# Patient Record
Sex: Female | Born: 1980 | Race: White | Hispanic: No | Marital: Married | Smoking: Never smoker
Health system: Southern US, Community
[De-identification: ages and names within clinical notes are randomized; demographics above are authoritative.]

## PROBLEM LIST (undated history)

## (undated) ENCOUNTER — Inpatient Hospital Stay (HOSPITAL_COMMUNITY): Payer: Self-pay

## (undated) DIAGNOSIS — Z789 Other specified health status: Secondary | ICD-10-CM

---

## 2012-01-15 ENCOUNTER — Inpatient Hospital Stay (HOSPITAL_COMMUNITY)
Admission: AD | Admit: 2012-01-15 | Discharge: 2012-01-15 | Disposition: A | Discharge status: Home / Self Care | Payer: BC Managed Care – PPO | Source: Ambulatory Visit | Attending: Obstetrics and Gynecology | Admitting: Obstetrics and Gynecology

## 2012-01-15 ENCOUNTER — Encounter (HOSPITAL_COMMUNITY): Payer: Self-pay | Admitting: *Deleted

## 2012-01-15 ENCOUNTER — Inpatient Hospital Stay (HOSPITAL_COMMUNITY): Payer: BC Managed Care – PPO

## 2012-01-15 DIAGNOSIS — O209 Hemorrhage in early pregnancy, unspecified: Secondary | ICD-10-CM

## 2012-01-15 HISTORY — DX: Other specified health status: Z78.9

## 2012-01-15 LAB — URINE MICROSCOPIC-ADD ON

## 2012-01-15 LAB — URINALYSIS, ROUTINE W REFLEX MICROSCOPIC
Glucose, UA: NEGATIVE mg/dL
Ketones, ur: NEGATIVE mg/dL
Leukocytes, UA: NEGATIVE
Nitrite: NEGATIVE
Protein, ur: NEGATIVE mg/dL
pH: 7 (ref 5.0–8.0)

## 2012-01-15 LAB — CBC
MCH: 31.4 pg (ref 26.0–34.0)
MCHC: 34.6 g/dL (ref 30.0–36.0)
Platelets: 265 10*3/uL (ref 150–400)
RDW: 12.7 % (ref 11.5–15.5)

## 2012-01-15 LAB — ABO/RH: ABO/RH(D): A POS

## 2012-01-15 LAB — POCT PREGNANCY, URINE: Preg Test, Ur: POSITIVE — AB

## 2012-01-15 LAB — WET PREP, GENITAL: Clue Cells Wet Prep HPF POC: NONE SEEN

## 2012-01-15 NOTE — Progress Notes (Signed)
Pt in c/o intermittent vaginal bleeding since last Wednesday.  Light up until today around 1630.  Started having cramping and went to bathroom, had bright red bleeding like start of period, with quarter-sized clot.  Has had bright red bleeding since then.  Has used 1 pad.

## 2012-01-15 NOTE — ED Provider Notes (Signed)
History   Pt presents today c/o vag spotting that began last Wed. She states she began bleeding heavily today around 4pm. She also c/o lower abd cramping that is worse on the left side. She denies fever, dysuria, or any other sx at this time.  No chief complaint on file.  HPI  OB History    Grav Para Term Preterm Abortions TAB SAB Ect Mult Living   1               Past Medical History  Diagnosis Date  . No pertinent past medical history     Past Surgical History  Procedure Date  . No past surgeries     Family History  Problem Relation Age of Onset  . Cancer Father   . Stroke Maternal Grandmother     History  Substance Use Topics  . Smoking status: Never Smoker   . Smokeless tobacco: Not on file  . Alcohol Use: No    Allergies: No Known Allergies  Prescriptions prior to admission  Medication Sig Dispense Refill  . Prenatal Vit-Fe Fumarate-FA (PRENATAL MULTIVITAMIN) TABS Take 1 tablet by mouth daily.        Review of Systems  Constitutional: Negative for fever.  Eyes: Negative for blurred vision and double vision.  Cardiovascular: Negative for chest pain and palpitations.  Gastrointestinal: Positive for abdominal pain. Negative for nausea, vomiting, diarrhea and constipation.  Genitourinary: Negative for dysuria, urgency, frequency and hematuria.  Neurological: Negative for dizziness and headaches.  Psychiatric/Behavioral: Negative for depression and suicidal ideas.   Physical Exam   Blood pressure 113/78, pulse 74, temperature 98.8 F (37.1 C), temperature source Oral, resp. rate 20, height 5\' 6"  (1.676 m), weight 131 lb 2 oz (59.478 kg), last menstrual period 11/26/2011, SpO2 99.00%.  Physical Exam  Nursing note and vitals reviewed. Constitutional: She is oriented to person, place, and time. She appears well-developed and well-nourished. No distress.  HENT:  Head: Normocephalic and atraumatic.  Eyes: EOM are normal.  GI: Soft. She exhibits no distension  and no mass. There is tenderness. There is no rebound and no guarding.  Genitourinary: There is bleeding around the vagina. No vaginal discharge found.       Cervix Lg/closed. Blood clot noted in the vag vault.  Neurological: She is alert and oriented to person, place, and time.  Skin: Skin is warm and dry. She is not diaphoretic.  Psychiatric: She has a normal mood and affect. Her behavior is normal. Judgment and thought content normal.    MAU Course  Procedures  Wet prep and GC/Chlamydia cultures done.  Results for orders placed during the hospital encounter of 01/15/12 (from the past 24 hour(s))  URINALYSIS, ROUTINE W REFLEX MICROSCOPIC     Status: Abnormal   Collection Time   01/15/12  8:19 PM      Component Value Range   Color, Urine AMBER (*) YELLOW    APPearance CLOUDY (*) CLEAR    Specific Gravity, Urine 1.010  1.005 - 1.030    pH 7.0  5.0 - 8.0    Glucose, UA NEGATIVE  NEGATIVE (mg/dL)   Hgb urine dipstick LARGE (*) NEGATIVE    Bilirubin Urine NEGATIVE  NEGATIVE    Ketones, ur NEGATIVE  NEGATIVE (mg/dL)   Protein, ur NEGATIVE  NEGATIVE (mg/dL)   Urobilinogen, UA 0.2  0.0 - 1.0 (mg/dL)   Nitrite NEGATIVE  NEGATIVE    Leukocytes, UA NEGATIVE  NEGATIVE   URINE MICROSCOPIC-ADD ON  Status: Abnormal   Collection Time   01/15/12  8:19 PM      Component Value Range   Squamous Epithelial / LPF RARE  RARE    RBC / HPF TOO NUMEROUS TO COUNT  <3 (RBC/hpf)   Bacteria, UA FEW (*) RARE   POCT PREGNANCY, URINE     Status: Abnormal   Collection Time   01/15/12  8:34 PM      Component Value Range   Preg Test, Ur POSITIVE (*) NEGATIVE   ABO/RH     Status: Normal   Collection Time   01/15/12  9:00 PM      Component Value Range   ABO/RH(D) A POS    HCG, QUANTITATIVE, PREGNANCY     Status: Abnormal   Collection Time   01/15/12  9:00 PM      Component Value Range   hCG, Beta Chain, Quant, S 2462 (*) <5 (mIU/mL)  CBC     Status: Abnormal   Collection Time   01/15/12  9:00 PM       Component Value Range   WBC 10.6 (*) 4.0 - 10.5 (K/uL)   RBC 4.17  3.87 - 5.11 (MIL/uL)   Hemoglobin 13.1  12.0 - 15.0 (g/dL)   HCT 16.1  09.6 - 04.5 (%)   MCV 90.9  78.0 - 100.0 (fL)   MCH 31.4  26.0 - 34.0 (pg)   MCHC 34.6  30.0 - 36.0 (g/dL)   RDW 40.9  81.1 - 91.4 (%)   Platelets 265  150 - 400 (K/uL)  WET PREP, GENITAL     Status: Normal   Collection Time   01/15/12  9:15 PM      Component Value Range   Yeast, Wet Prep NONE SEEN  NONE SEEN    Trich, Wet Prep NONE SEEN  NONE SEEN    Clue Cells, Wet Prep NONE SEEN  NONE SEEN    WBC, Wet Prep HPF POC NONE SEEN  NONE SEEN    US Ob Comp Less 14 Wks  01/15/2012  *RADIOLOGY REPORT*  Clinical Data: Bleeding, cramping; quantitative beta HCG 2462  OBSTETRIC <14 WK [place] AND TRANSVAGINAL OB US  Technique:  Both transabdominal and transvaginal ultrasound examinations were performed for complete evaluation of the gestation as well as the maternal uterus, adnexal regions, and pelvic cul-de-sac.  Transvaginal technique was performed to assess early pregnancy.  Comparison:  None  Intrauterine gestational sac:  Not identified Yolk sac: N/A Embryo: N/A Cardiac Activity: N/A Heart Rate: N/A bpm  Maternal uterus/adnexae: Left ovary normal size and morphology, 3.2 x 1.8 x 2.3 cm. Right ovary normal size and morphology, 4.3 x 1.8 x 2.0 cm. Endometrial complex appears abnormally thickened and inhomogeneous at 16 mm diameter. No endometrial fluid, fluid collection, or products of conception identified. Trace free pelvic fluid. No focal uterine mass. No adnexal masses seen.  IMPRESSION: No intrauterine gestation identified. Abnormally thickened and slightly heterogeneous appearing endometrial complex, question blood. With observed quantitative beta HCG, a pregnancy should be visible. Differential diagnosis includes spontaneous abortion and ectopic pregnancy. Follow-up serial quantitative beta HCG and/or ultrasound recommended to definitively exclude ectopic pregnancy.   Original Report Authenticated By: Lollie Marrow, M.D.   US Ob Transvaginal  01/15/2012  *RADIOLOGY REPORT*  Clinical Data: Bleeding, cramping; quantitative beta HCG 2462  OBSTETRIC <14 WK [place] AND TRANSVAGINAL OB US  Technique:  Both transabdominal and transvaginal ultrasound examinations were performed for complete evaluation of the gestation as well as the maternal  uterus, adnexal regions, and pelvic cul-de-sac.  Transvaginal technique was performed to assess early pregnancy.  Comparison:  None  Intrauterine gestational sac:  Not identified Yolk sac: N/A Embryo: N/A Cardiac Activity: N/A Heart Rate: N/A bpm  Maternal uterus/adnexae: Left ovary normal size and morphology, 3.2 x 1.8 x 2.3 cm. Right ovary normal size and morphology, 4.3 x 1.8 x 2.0 cm. Endometrial complex appears abnormally thickened and inhomogeneous at 16 mm diameter. No endometrial fluid, fluid collection, or products of conception identified. Trace free pelvic fluid. No focal uterine mass. No adnexal masses seen.  IMPRESSION: No intrauterine gestation identified. Abnormally thickened and slightly heterogeneous appearing endometrial complex, question blood. With observed quantitative beta HCG, a pregnancy should be visible. Differential diagnosis includes spontaneous abortion and ectopic pregnancy. Follow-up serial quantitative beta HCG and/or ultrasound recommended to definitively exclude ectopic pregnancy.  Original Report Authenticated By: Lollie Marrow, M.D.     Assessment and Plan  Bleeding in preg: discussed with pt at length. She will return in 2 days for repeat B-quant. Discussed diet, activity, risks, and precautions.  Clinton Gallant. Rice III, DrHSc, MPAS, PA-C  01/15/2012, 9:18 PM   Henrietta Hoover, PA 01/15/12 2251

## 2012-01-15 NOTE — Progress Notes (Signed)
PT SAYS    ON WED- SHE HAD BROWN SPOTTING WHEN SHE WIPED- CALLED DR-  NO SEX- NO LIFTING.   Thursday OK.  THEN  FRI- BLEEDING HEAVY- RED - 1 EPISODE- PT THOUGHT NL.  SAT/ SUN- LIGHT  BROWN D/C.   THIS AM HAD CLEAR D/C.   THEN TONIGHT AT 430- FELT MORE CRAMPING- TO B-ROOM-  HAD  QUARTER SIZE CLOT- FELT LIKE CYCLE- CRAMPING LOWER ABD- WITH OCC SHARP PAIN.  [name]  SINCE THEN WENT TO B-ROOM  X2-  WITH SMALL CLOTS.  IN TRIAGE-  PAD-  ON SMALL AMT LIGHT RED . [name]

## 2012-01-16 LAB — GC/CHLAMYDIA PROBE AMP, GENITAL: Chlamydia, DNA Probe: NEGATIVE

## 2012-08-02 ENCOUNTER — Ambulatory Visit (INDEPENDENT_AMBULATORY_CARE_PROVIDER_SITE_OTHER): Payer: BC Managed Care – PPO

## 2012-08-02 VITALS — BP 102/60 | Ht 66.0 in | Wt 125.0 lb

## 2012-08-02 DIAGNOSIS — Z7189 Other specified counseling: Secondary | ICD-10-CM

## 2012-08-11 NOTE — Progress Notes (Signed)
Pt presents at [redacted]w[redacted]d for free consult to learn about practice, and is interested in transferring care from [facility].   Questions r/e practice routines and midwifery care answered.  Pt's husband at visit.  They are self-pay, so Raelene Bott, Geophysicist/field seismologist also answered Questions, as Laurel Dimmer (office administrator) was out of office.  If pt continues to desire transfer, instructed her to schedule NOB interview and work-up, and also disc'd upcoming anatomy U/s for 19-20.  F/u prn.

## 2012-10-11 LAB — OB RESULTS CONSOLE HIV ANTIBODY (ROUTINE TESTING): HIV: NONREACTIVE

## 2012-10-11 LAB — OB RESULTS CONSOLE ABO/RH

## 2012-10-11 LAB — OB RESULTS CONSOLE GC/CHLAMYDIA: Gonorrhea: NEGATIVE

## 2012-10-11 LAB — OB RESULTS CONSOLE ANTIBODY SCREEN: Antibody Screen: NEGATIVE

## 2012-10-15 IMAGING — US US OB COMP LESS 14 WK
1 series · 13 of 28 positions shown · non-contrast
Comparison: None

CLINICAL DATA: Bleeding, cramping; quantitative beta HCG 4074

OBSTETRIC <14 WK US AND TRANSVAGINAL OB US
TECHNIQUE: Both transabdominal and transvaginal ultrasound
examinations were performed for complete evaluation of the
gestation as well as the maternal uterus, adnexal regions, and
pelvic cul-de-sac.  Transvaginal technique was performed to assess
early pregnancy.

[Series 1: us ob comp less 14 wks · 13 of 31 slices shown]
[im 2/31]
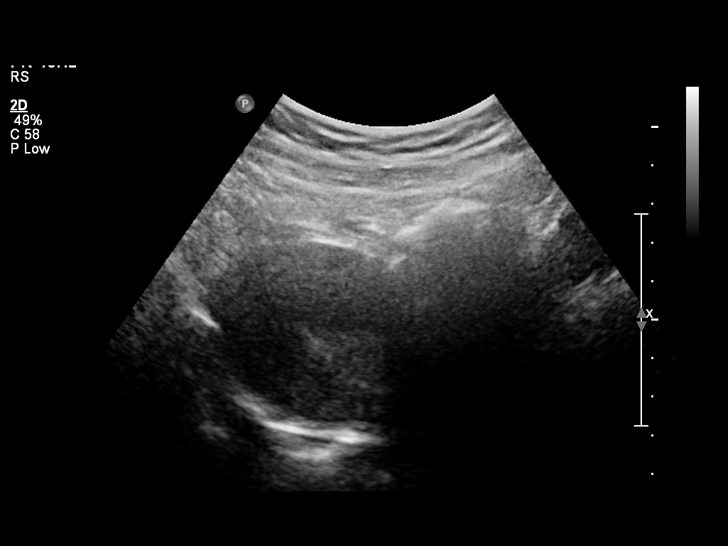
[im 4/31]
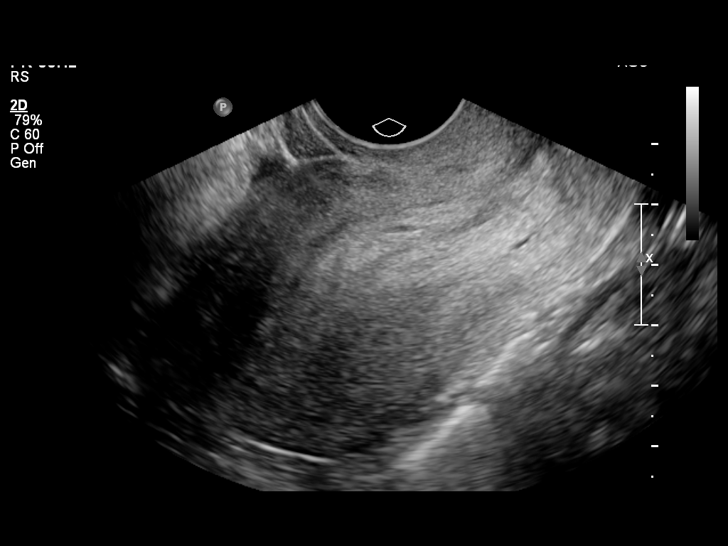
[im 6/31]
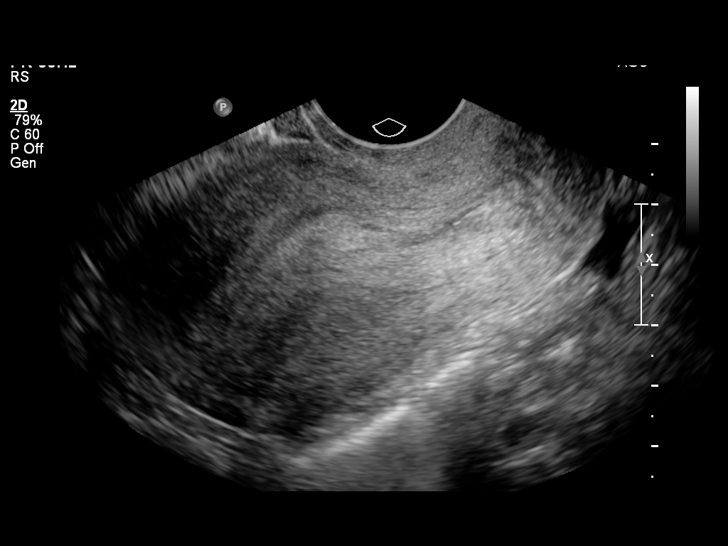
[im 8/31]
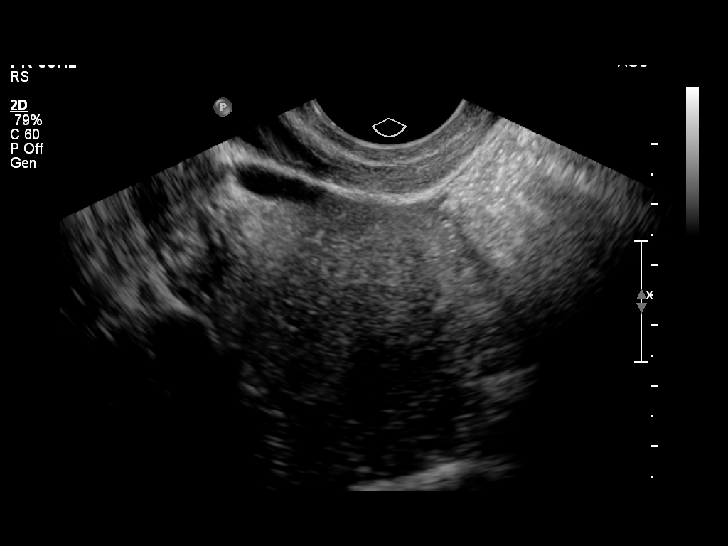
[im 11/31]
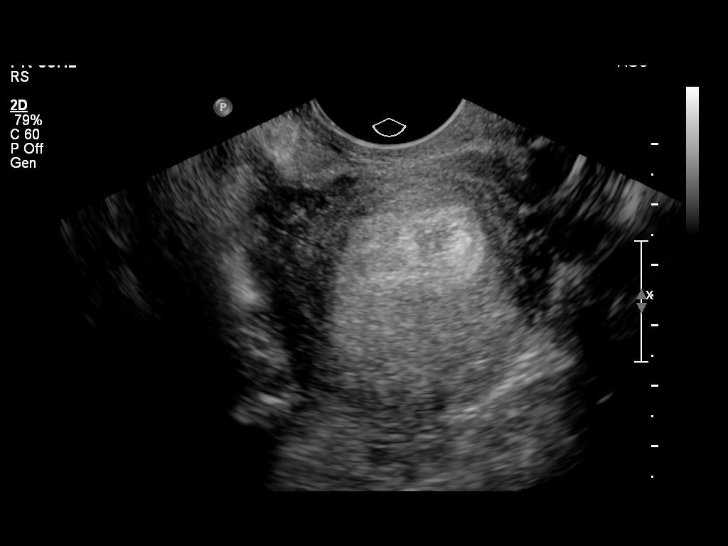
[im 13/31]
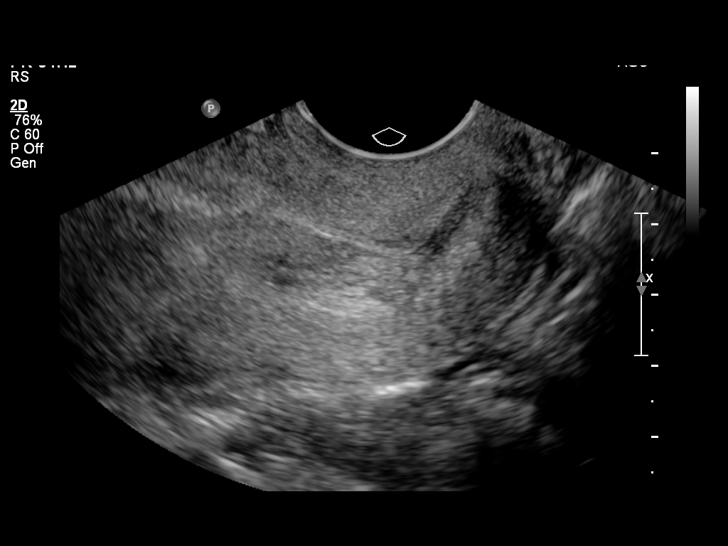
[im 16/31]
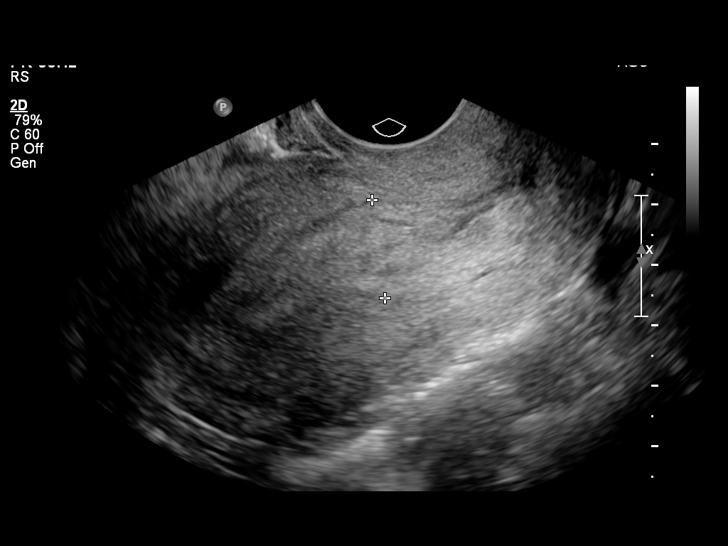
[im 18/31]
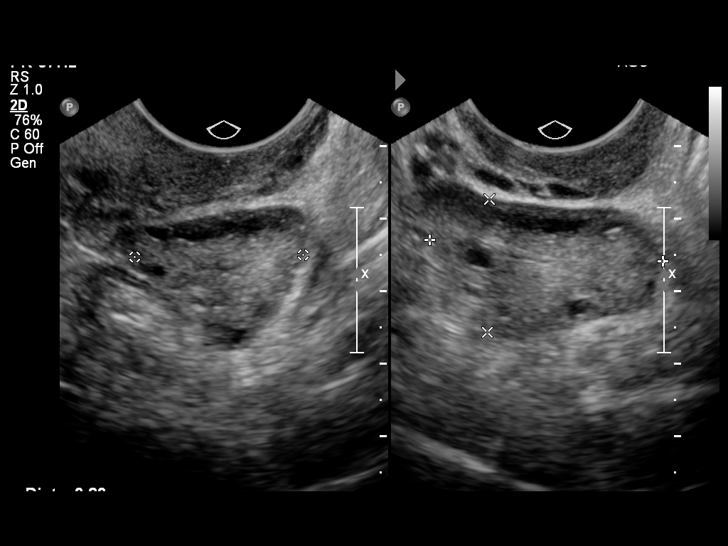
[im 21/31]
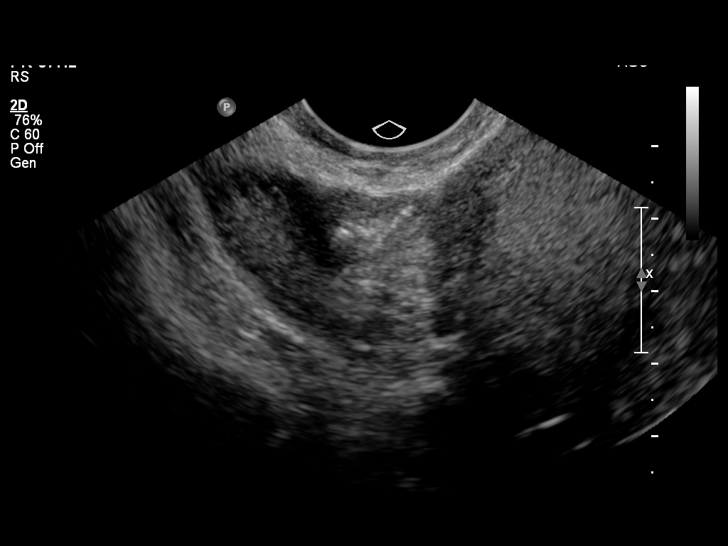
[im 23/31]
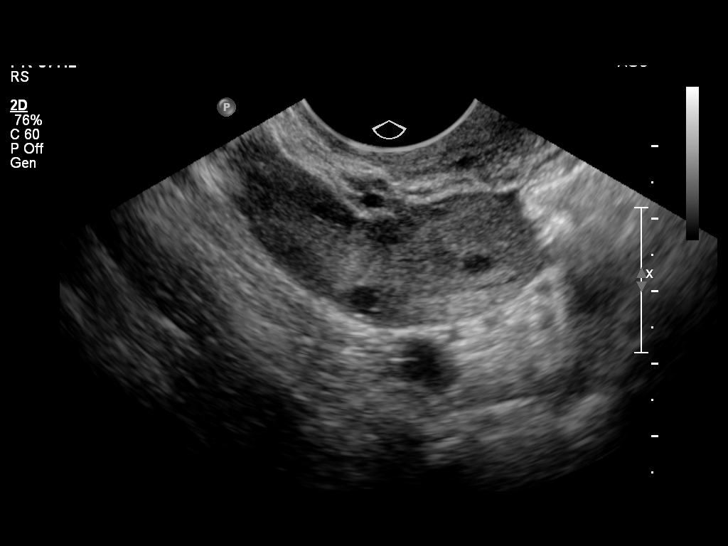
[im 25/31]
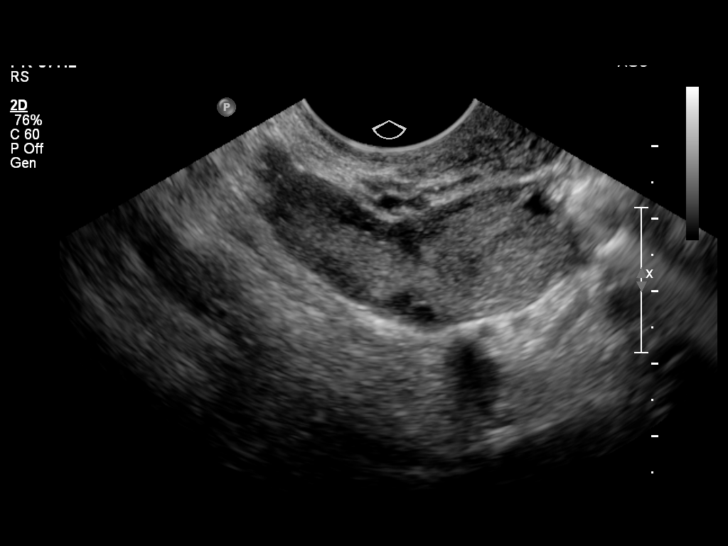
[im 27/31]
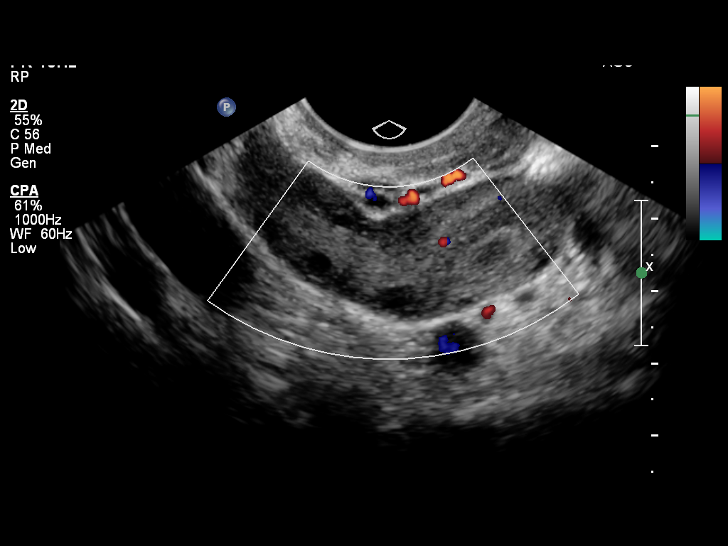
[im 29/31]
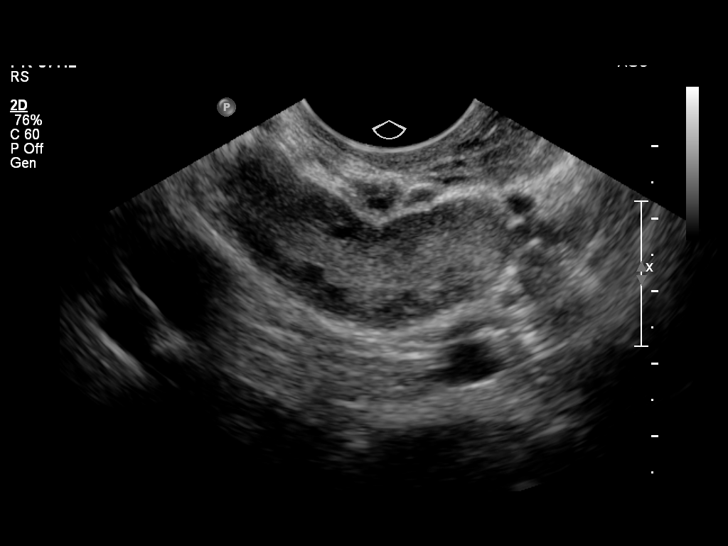

[13 of 28 positions shown; findings below may reference images not displayed]

Intrauterine gestational sac:  Not identified
Yolk sac: N/A
Embryo: N/A
Cardiac Activity: N/A
Heart Rate: N/A bpm

Maternal uterus/adnexae:
Left ovary normal size and morphology, 3.2 x 1.8 x 2.3 cm.
Right ovary normal size and morphology, 4.3 x 1.8 x 2.0 cm.
Endometrial complex appears abnormally thickened and inhomogeneous
at 16 mm diameter.
No endometrial fluid, fluid collection, or products of conception
identified.
Trace free pelvic fluid.
No focal uterine mass.
No adnexal masses seen.
IMPRESSION: No intrauterine gestation identified.
Abnormally thickened and slightly heterogeneous appearing
endometrial complex, question blood.
With observed quantitative beta HCG, a pregnancy should be visible.
Differential diagnosis includes spontaneous abortion and ectopic
pregnancy.
Follow-up serial quantitative beta HCG and/or ultrasound
recommended to definitively exclude ectopic pregnancy.

## 2013-01-01 LAB — OB RESULTS CONSOLE GBS: GBS: NEGATIVE

## 2013-02-01 ENCOUNTER — Inpatient Hospital Stay (HOSPITAL_COMMUNITY): Payer: BC Managed Care – PPO | Admitting: Anesthesiology

## 2013-02-01 ENCOUNTER — Encounter (HOSPITAL_COMMUNITY): Payer: Self-pay | Admitting: Anesthesiology

## 2013-02-01 ENCOUNTER — Encounter (HOSPITAL_COMMUNITY): Payer: Self-pay | Admitting: *Deleted

## 2013-02-01 ENCOUNTER — Inpatient Hospital Stay (HOSPITAL_COMMUNITY)
Admission: AD | Admit: 2013-02-01 | Discharge: 2013-02-04 | DRG: 370 | Disposition: A | Discharge status: Home / Self Care | Payer: BC Managed Care – PPO | Source: Ambulatory Visit | Attending: Obstetrics & Gynecology | Admitting: Obstetrics & Gynecology

## 2013-02-01 ENCOUNTER — Encounter (HOSPITAL_COMMUNITY)
Admission: AD | Disposition: A | Discharge status: Home / Self Care | Payer: Self-pay | Source: Ambulatory Visit | Attending: Obstetrics & Gynecology

## 2013-02-01 DIAGNOSIS — D62 Acute posthemorrhagic anemia: Secondary | ICD-10-CM | POA: Diagnosis not present

## 2013-02-01 DIAGNOSIS — Z98891 History of uterine scar from previous surgery: Secondary | ICD-10-CM | POA: Diagnosis present

## 2013-02-01 DIAGNOSIS — O9903 Anemia complicating the puerperium: Secondary | ICD-10-CM | POA: Diagnosis not present

## 2013-02-01 DIAGNOSIS — O324XX0 Maternal care for high head at term, not applicable or unspecified: Secondary | ICD-10-CM | POA: Diagnosis present

## 2013-02-01 LAB — CBC
MCH: 34.1 pg — ABNORMAL HIGH (ref 26.0–34.0)
MCV: 93.6 fL (ref 78.0–100.0)
Platelets: 173 10*3/uL (ref 150–400)
RBC: 3.61 MIL/uL — ABNORMAL LOW (ref 3.87–5.11)
RDW: 12.7 % (ref 11.5–15.5)

## 2013-02-01 LAB — OB RESULTS CONSOLE GBS: GBS: NEGATIVE

## 2013-02-01 SURGERY — Surgical Case
Anesthesia: Spinal | Wound class: Clean Contaminated

## 2013-02-01 MED ORDER — OXYTOCIN BOLUS FROM INFUSION: 500.0000 mL | INTRAVENOUS | Status: DC

## 2013-02-01 MED ORDER — LIDOCAINE HCL (PF) 1 % IJ SOLN: 30.0000 mL | INTRAMUSCULAR | Status: DC | PRN

## 2013-02-01 MED ORDER — LACTATED RINGERS IV SOLN: INTRAVENOUS | Status: DC

## 2013-02-01 MED ORDER — OXYTOCIN 40 UNITS IN LACTATED RINGERS INFUSION - SIMPLE MED: 62.5000 mL/h | INTRAVENOUS | Status: DC

## 2013-02-01 MED ORDER — ONDANSETRON HCL 4 MG/2ML IJ SOLN: 4.0000 mg | Freq: Four times a day (QID) | INTRAMUSCULAR | Status: DC | PRN

## 2013-02-01 MED ORDER — CITRIC ACID-SODIUM CITRATE 334-500 MG/5ML PO SOLN
30.0000 mL | ORAL | Status: DC | PRN
  Administered 2013-02-01: 30 mL via ORAL
  Filled 2013-02-01: qty 15

## 2013-02-01 MED ORDER — IBUPROFEN 600 MG PO TABS: 600.0000 mg | ORAL_TABLET | Freq: Four times a day (QID) | ORAL | Status: DC | PRN

## 2013-02-01 MED ORDER — ACETAMINOPHEN 325 MG PO TABS: 650.0000 mg | ORAL_TABLET | ORAL | Status: DC | PRN

## 2013-02-01 MED ORDER — MORPHINE SULFATE (PF) 0.5 MG/ML IJ SOLN
INTRAMUSCULAR | Status: DC | PRN
  Administered 2013-02-01: 150 ug via INTRATHECAL

## 2013-02-01 MED ORDER — LACTATED RINGERS IV SOLN: 500.0000 mL | INTRAVENOUS | Status: DC | PRN

## 2013-02-01 MED ORDER — OXYCODONE-ACETAMINOPHEN 5-325 MG PO TABS: 1.0000 | ORAL_TABLET | ORAL | Status: DC | PRN

## 2013-02-01 MED ORDER — FENTANYL CITRATE 0.05 MG/ML IJ SOLN
INTRAMUSCULAR | Status: DC | PRN
  Administered 2013-02-01: 25 ug via INTRATHECAL

## 2013-02-01 MED ORDER — BUPIVACAINE IN DEXTROSE 0.75-8.25 % IT SOLN
INTRATHECAL | Status: DC | PRN
  Administered 2013-02-01: 13 mg via INTRATHECAL

## 2013-02-01 MED ORDER — CEFAZOLIN SODIUM-DEXTROSE 2-3 GM-% IV SOLR
2.0000 g | INTRAVENOUS | Status: DC
  Filled 2013-02-01: qty 50

## 2013-02-01 SURGICAL SUPPLY — 40 items
BENZOIN TINCTURE PRP APPL 2/3 (GAUZE/BANDAGES/DRESSINGS) ×2 IMPLANT
CLOTH BEACON ORANGE TIMEOUT ST (SAFETY) ×2 IMPLANT
CONTAINER PREFILL 10% NBF 15ML (MISCELLANEOUS) IMPLANT
DRAPE LG THREE QUARTER DISP (DRAPES) ×2 IMPLANT
DRESSING TELFA 8X3 (GAUZE/BANDAGES/DRESSINGS) IMPLANT
DRSG OPSITE POSTOP 4X10 (GAUZE/BANDAGES/DRESSINGS) ×2 IMPLANT
DURAPREP 26ML APPLICATOR (WOUND CARE) ×2 IMPLANT
ELECT REM PT RETURN 9FT ADLT (ELECTROSURGICAL) ×2
ELECTRODE REM PT RTRN 9FT ADLT (ELECTROSURGICAL) ×1 IMPLANT
EXTRACTOR VACUUM KIWI (MISCELLANEOUS) IMPLANT
EXTRACTOR VACUUM M CUP 4 TUBE (SUCTIONS) IMPLANT
GAUZE SPONGE 4X4 12PLY STRL LF (GAUZE/BANDAGES/DRESSINGS) IMPLANT
GLOVE BIO SURGEON STRL SZ7 (GLOVE) ×2 IMPLANT
GLOVE BIOGEL PI IND STRL 7.0 (GLOVE) ×2 IMPLANT
GLOVE BIOGEL PI INDICATOR 7.0 (GLOVE) ×2
GOWN PREVENTION PLUS LG XLONG (DISPOSABLE) ×6 IMPLANT
KIT ABG SYR 3ML LUER SLIP (SYRINGE) IMPLANT
NEEDLE HYPO 25X5/8 SAFETYGLIDE (NEEDLE) IMPLANT
NS IRRIG 1000ML POUR BTL (IV SOLUTION) ×2 IMPLANT
PACK C SECTION WH (CUSTOM PROCEDURE TRAY) ×2 IMPLANT
PAD ABD 7.5X8 STRL (GAUZE/BANDAGES/DRESSINGS) IMPLANT
PAD OB MATERNITY 4.3X12.25 (PERSONAL CARE ITEMS) ×2 IMPLANT
RTRCTR C-SECT PINK 25CM LRG (MISCELLANEOUS) IMPLANT
SLEEVE SCD COMPRESS KNEE MED (MISCELLANEOUS) ×2 IMPLANT
STAPLER VISISTAT 35W (STAPLE) IMPLANT
STRIP CLOSURE SKIN 1/2X4 (GAUZE/BANDAGES/DRESSINGS) ×2 IMPLANT
SUT MNCRL 0 VIOLET CTX 36 (SUTURE) ×3 IMPLANT
SUT MONOCRYL 0 CTX 36 (SUTURE) ×3
SUT PLAIN 0 NONE (SUTURE) IMPLANT
SUT PLAIN 2 0 (SUTURE) ×1
SUT PLAIN ABS 2-0 CT1 27XMFL (SUTURE) ×1 IMPLANT
SUT VIC AB 0 CT1 27 (SUTURE) ×2
SUT VIC AB 0 CT1 27XBRD ANBCTR (SUTURE) ×2 IMPLANT
SUT VIC AB 2-0 CT1 27 (SUTURE) ×2
SUT VIC AB 2-0 CT1 TAPERPNT 27 (SUTURE) ×2 IMPLANT
SUT VIC AB 4-0 KS 27 (SUTURE) ×2 IMPLANT
SUT VICRYL 0 TIES 12 18 (SUTURE) IMPLANT
TOWEL OR 17X24 6PK STRL BLUE (TOWEL DISPOSABLE) ×6 IMPLANT
TRAY FOLEY CATH 14FR (SET/KITS/TRAYS/PACK) ×2 IMPLANT
WATER STERILE IRR 1000ML POUR (IV SOLUTION) ×2 IMPLANT

## 2013-02-01 NOTE — Progress Notes (Signed)
Rhonda Parrish is a 32 y.o. G2P0010 at [redacted]w[redacted]d, active labor, natural progression to complete and pushing since 6.30 pm with few breaks. Maternal exhaustion and not progress in fetal position/.   Objective: BP 127/70  Pulse 104  Temp(Src) 98.2 F (36.8 C) (Oral)  Resp 20  Ht 5\' 5"  (1.651 m)  Wt 157 lb (71.215 kg)  BMI 26.13 kg/m2  LMP 04/25/2012   FHR: 150s bpm, variability: moderate,  accelerations:  Present,  decelerations:  Absent UC: q 2 -3 min SVE:  Complete/+1, OT, cannot turn with pushing or manual rotation.   Assessment / Plan: Arrest of descent, ROT position, caput and moulding noted. EBW 6-7 lbs.  Need to proceed with cesarean delivery. Patient anxious and hesitant, recommendation made after careful consideration of operative vag delivery as well but station will not permit.   Risks/complications of surgery reviewed incl infection, bleeding, damage to internal organs including bladder, bowels, ureters, blood vessels, other risks from anesthesia, VTE and delayed complications of any surgery, complications in future surgery reviewed. Also discussed neonatal complications incl difficult delivery, laceration, vacuum assistance, TTN etc. Pt understands and agrees, all concerns addressed.    Rhonda Parrish R 02/01/2013, 11:05 PM

## 2013-02-01 NOTE — Progress Notes (Signed)
Pushing with each contraction O VSS BP 115/62  Pulse 85  Temp(Src) 98.6 F (37 C) (Oral)  Resp 16  Ht 5\' 5"  (1.651 m)  Wt 71.215 kg (157 lb)  BMI 26.13 kg/m2  LMP 04/25/2012       fhts category 135 - 150 bpm      abd soft between uc      Contractions 1: 3 mins      Vag 10/100%/+1, Vx with no caput A Pushing well and coping well P Expectant SVD.  Earl Gala, CNM.

## 2013-02-01 NOTE — MAU Note (Signed)
Contractions started at 0515 this morning

## 2013-02-01 NOTE — Progress Notes (Signed)
Patient is now getting tired and out of tub and resting bed for short duration. O VSS BP 120/76  Pulse 87  Temp(Src) 98.6 F (37 C) (Oral)  Resp 20  Ht 5\' 5"  (1.651 m)  Wt 157 lb (71.215 kg)  BMI 26.13 kg/m2  LMP 04/25/2012       Fhts 140 bpm -intermittent monitoring.      abd soft between uc      Contractions have spaced 1: 4 - 5 mins and shorter duration of less than 60 secs      Have advised AROM to augment labor but patient stated that she will think about it.      Doula wants to try nipple stimulation. patient and patient's husband what to try this method of oxytocin release prior       To AROM.       As patient is tried, offered patient  IM narcotics and refused and CNM was told not to offer medication again.      Vag 7/90/0  To  +1 station. Intact - membranes palpated on front of baby's head.      Patient now out on toilet and rocking on toilet. A  Contraction  Pattern has protracted  - AROM advised. P  Wait for patient to see if will allow AROM.      No narcotics to be offered.     To try nipple stimulation prior to AROM     To use Birthing Tub as per protocol      Intermittent Fetal Monitoring.  Earl Gala, CNM.

## 2013-02-01 NOTE — Progress Notes (Signed)
Patient has been resting in bed and breathing through contractions O VSS BP 105/57  Pulse 83  Temp(Src) 98.6 F (37 C) (Oral)  Resp 18  Ht 5\' 5"  (1.651 m)  Wt 157 lb (71.215 kg)  BMI 26.13 kg/m2  LMP 04/25/2012       fhts 140 bpm       abd soft between uc      Contractions 1: 3 mins      Vag 9/90%/+1      Wants to use [employer] to aid descent of  Vx.      Doula and Husband as birth coaches A Progressing well, Birthing Ball  And gravity to aid descent of Vx  P Continue care working towards, SVD.  Earl Gala, CNM.

## 2013-02-01 NOTE — Progress Notes (Signed)
Patient out of tub at 21.15 hrs, Positioned on bed in kneeling position and patient has tried to sqwat to push. Advised to change position to left later push. Mother now had started to push effectively. Patient has pushed for 40 - 45 mins with slow descent. Some caput evident. Mother has changed to semi fowler position and pushing effectively. Contacted Dr. Juliene Pina re reservations of [employer] in Pelvis and possible shoulders. O VSS BP 127/70  Pulse 104  Temp(Src) 98.2 F (36.8 C) (Oral)  Resp 20  Ht 5\' 5"  (1.651 m)  Wt 71.215 kg (157 lb)  BMI 26.13 kg/m2  LMP 04/25/2012       fhts 150 bpm      abd soft between uc      Contractions: 1: 4 mins      Vx slight descent and with better pushing had started to move baby more effectively A : Slow descent- consult Dr Juliene Pina for assessment and best management plan. P  Dr. Juliene Pina to bedside for assessment  Earl Gala, CNM.

## 2013-02-01 NOTE — Treatment Plan (Signed)
Report called to Belenda Cruise, RN charge RN. Pt may go to [phone]

## 2013-02-01 NOTE — Progress Notes (Signed)
Patient is 2 hrs pushing at 20.06 hrs O VSS BP 115/62  Pulse 85  Temp(Src) 98.6 F (37 C) (Oral)  Resp 16  Ht 5\' 5"  (1.651 m)  Wt 71.215 kg (157 lb)  BMI 26.13 kg/m2  LMP 04/25/2012       fhts 140 bpm, intermittent auscultation      abd soft between uc      Contractions 1: 3 - 4 mins      A Pushing with each contraction P Expectant management towards SVD  Earl Gala, CNM.

## 2013-02-01 NOTE — Anesthesia Preprocedure Evaluation (Signed)
Anesthesia Evaluation  Patient identified by MRN, date of birth, ID band Patient awake    Reviewed: Allergy & Precautions, H&P , NPO status , Patient's Chart, lab work & pertinent test results  Airway Mallampati: III TM Distance: >3 FB Neck ROM: full    Dental no notable dental hx. (+) Teeth Intact   Pulmonary neg pulmonary ROS,    Pulmonary exam normal       Cardiovascular negative cardio ROS  Rhythm:regular Rate:Normal     Neuro/Psych negative neurological ROS     GI/Hepatic negative GI ROS, Neg liver ROS,   Endo/Other  negative endocrine ROS  Renal/GU negative Renal ROS     Musculoskeletal   Abdominal Normal abdominal exam  (+)   Peds  Hematology negative hematology ROS (+)   Anesthesia Other Findings   Reproductive/Obstetrics (+) Pregnancy                           Anesthesia Physical Anesthesia Plan  ASA: II and emergent  Anesthesia Plan: Spinal   Post-op Pain Management:    Induction:   Airway Management Planned:   Additional Equipment:   Intra-op Plan:   Post-operative Plan:   Informed Consent: I have reviewed the patients History and Physical, chart, labs and discussed the procedure including the risks, benefits and alternatives for the proposed anesthesia with the patient or authorized representative who has indicated his/her understanding and acceptance.   Dental Advisory Given  Plan Discussed with: Anesthesiologist, CRNA and Surgeon  Anesthesia Plan Comments:         Anesthesia Quick Evaluation

## 2013-02-01 NOTE — H&P (Signed)
Rhonda Parrish is a 32 y.o. female, G3P0010 at [redacted]w[redacted]d weeks, presenting for labor progress evaluation. Plans water birth and has Doula to help with birthing process   There is no problem list on file for this patient.   History of present pregnancy: Patient entered care at 18weeks. As transfer from [facility].  EDC of 04/25/2013 was established by LMP.   Anatomy scan:  18 weeks, with normal findings and an posterior placenta 3 vessel cord  Additional [place] evaluations: none .   Significant prenatal events: none Last evaluation: MAU today 02/01/13  OB History   Grav Para Term Preterm Abortions TAB SAB Ect Mult Living   3    1  1         Past Medical History  Diagnosis Date  . No pertinent past medical history    Past Surgical History  Procedure Laterality Date  . No past surgeries     Family History: family history includes Cancer in her father and Stroke in her maternal grandmother. Social History:  reports that she has never smoked. She has never used smokeless tobacco. She reports that she does not drink alcohol or use illicit drugs.   Prenatal Transfer Tool  Maternal Diabetes: No Genetic Screening: Normal Maternal Ultrasounds/Referrals: Normal Fetal Ultrasounds or other Referrals:  None Maternal Substance Abuse:  No Significant Maternal Medications:  None Significant Maternal Lab Results: None   No Known Allergies  Physical Examination: Affect: AAO x 3 in active labor and anxious Lungs: CTAB CV: RRR Abdomen; Gravid to dates FHT"s 140 bpm baseline SVE: Dilation: 5 Station: +1 Exam by:: Octavio Manns, CNM Blood pressure 120/76, pulse 87, temperature 98.1 F (36.7 C), temperature source Oral, resp. rate 20, height 5\' 5"  (1.651 m), weight 157 lb (71.215 kg), last menstrual period 04/25/2012, unknown if currently breastfeeding. Extremities: no edema/ swelling bilaterally upper and lower  FHR: 140 bpm UCs:  1: 3 - 5 mins  Prenatal labs: ABO, Rh:  A  positive Antibody:  neg Rubella:   imm RPR:   NR HBsAg:   neg HIV:   neg GBS:  neg Sickle cell/Hgb electrophoresis:  Not completed Pap:  Normal GC: neg Chlamydia:  neg Genetic screenings: Normal Glucola:  Neg.  Assessment/Plan: IUP at [redacted]w[redacted]d Active labor and desires water birth  Plan: Admit to birthing suites Patient does not desire IV access Desires Water Birth and has Doula as birth coach GBS: neg EFM tracing/ possible intermittent monitoring Collaboration on plan with Dr.  Lissa Merlin, DENISECNM. 02/01/2013, 11:45 AM

## 2013-02-01 NOTE — Progress Notes (Signed)
Feeling more pressure with each contraction and has urge to bare down pressure. Becoming more vocal with each contraction. O VSS      fhts 140 bpm      abd soft between uc      Contractions 1: 3 mins      Vag 10/100%/+1      Back in birthing tub with husband      Strong urge to push with each contraction A : 10/100%/+1, Vx  Pushing with each contraction. P: Labor support and Intermittent monitoring after each contraction      Expectant SVD - possible water birth  Earl Gala, [place].

## 2013-02-01 NOTE — Progress Notes (Addendum)
Patient has been out of tub for 30 min interval after 90 mins in tub Had been sitting on the toilet rocking. Back in Tub after 30 min interval  BP 120/76  Pulse 87  Temp(Src) 98.6 F (37 C) (Oral)  Resp 20  Ht 5\' 5"  (1.651 m)  Wt 157 lb (71.215 kg)  BMI 26.13 kg/m2  LMP 04/25/2012 VSS      fhts 145 bpm  Intermittent monitoring.      abd soft between uc      Contractions  1: 4  mins      Vag 7/90%/, 0 to +1, Vx, Membranes palpated on front of babies head. No SROM as though at 14.40hrs.       Patient states that she is getting slightly tired. Offered to come out of tub and rest in bed with IM narcotic to aid rest       and some sleep. Declined offer at this time. Remains in tub with Doula as birth coach helping her through the               contractions and breathing to relax in between contractions.  A   Steady progress with Cx dilation, Patient is resting well between contractions. Declines analgesia. P  Patient has requested to remain in tub and continue with labor support.     Tub water maintained at comfortable 98.8 - 99.1 F Temp  Earl Gala, CNM.

## 2013-02-01 NOTE — Progress Notes (Signed)
Patient continues to push with each contraction, coping well. O VSS BP 105/57  Pulse 83  Temp(Src) 98.6 F (37 C) (Oral)  Resp 18  Ht 5\' 5"  (1.651 m)  Wt 71.215 kg (157 lb)  BMI 26.13 kg/m2  LMP 04/25/2012       Fht, 135 bpm - auscultation with each contraction      abd soft between uc      Contractions 1: 3 mins - palpate strong      Laboring in the tub A   Pushing well with each contraction  - strong urge to bare down P  Continue labor support and working towards SVD.  Earl Gala, CNM.

## 2013-02-01 NOTE — Progress Notes (Signed)
Patient has been pushing for 1 hr at 19.06 hrs. Pushing well with each contraction and coping well. Reamins in Birthing Tub. O VSS    BP 115/62  Pulse 85  Temp(Src) 98.6 F (37 C) (Oral)  Resp 16  Ht 5\' 5"  (1.651 m)  Wt 71.215 kg (157 lb)  BMI 26.13 kg/m2  LMP 04/25/2012       fhts 135 bpm - intermittent auscultation       abd soft between uc      Contractions 1: 3 mins       Offered patient Vaginal assessment for descent but declined - will re approach this subject in about 30 mins for        Assessment.  A Pushing for 1 hr and coping well  Decline eval for Fetal descent at present P Continue present management working towards SVD.  Earl Gala, CNM.

## 2013-02-01 NOTE — H&P (Signed)
Reviewed and agree with note and plan. V.Eufemia Prindle, MD  

## 2013-02-01 NOTE — Anesthesia Procedure Notes (Signed)
Spinal  Patient location during procedure: OR Start time: 02/01/2013 11:54 PM Staffing Anesthesiologist: Sahar Ryback A. Performed by: anesthesiologist  Preanesthetic Checklist Completed: patient identified, site marked, surgical consent, pre-op evaluation, timeout performed, IV checked, risks and benefits discussed and monitors and equipment checked Spinal Block Patient position: sitting Prep: site prepped and draped and DuraPrep Patient monitoring: heart rate, cardiac monitor, continuous pulse ox and blood pressure Approach: midline Location: L3-4 Injection technique: single-shot Needle Needle type: Sprotte  Needle gauge: 24 G Needle length: 9 cm Needle insertion depth: 5 cm Assessment Sensory level: T4 Additional Notes Patient tolerated procedure well. Adequate sensory level.

## 2013-02-01 NOTE — Progress Notes (Addendum)
Patient is now in birthing pool. Doula at side and coping well and breathing through contractions. Husband is also being labor support.        fhts 145 bpm. Off monitoring at present and using intermittent monitoring.       abd soft between uc      Contractions 1: 3 - 4 mins and having some coupling of contractions. A Patient had last vaginal examination at 5 cm and asked for no further examinations.  Feeling more pressure with each contraction and has refused an examination to check progress P Intermittent EFM for 10 - 15 min each 1hr until pushing    Continue present management.    Patient to get out of water at 90 mins of being in tub.  Earl Gala, CNM.

## 2013-02-02 ENCOUNTER — Encounter (HOSPITAL_COMMUNITY): Payer: Self-pay | Admitting: Obstetrics

## 2013-02-02 DIAGNOSIS — Z98891 History of uterine scar from previous surgery: Secondary | ICD-10-CM | POA: Diagnosis present

## 2013-02-02 DIAGNOSIS — O9902 Anemia complicating childbirth: Secondary | ICD-10-CM | POA: Insufficient documentation

## 2013-02-02 LAB — CBC
HCT: 28.2 % — ABNORMAL LOW (ref 36.0–46.0)
Platelets: 141 10*3/uL — ABNORMAL LOW (ref 150–400)
RBC: 3.02 MIL/uL — ABNORMAL LOW (ref 3.87–5.11)
RDW: 12.8 % (ref 11.5–15.5)
WBC: 19.3 10*3/uL — ABNORMAL HIGH (ref 4.0–10.5)

## 2013-02-02 LAB — RPR: RPR Ser Ql: NONREACTIVE

## 2013-02-02 MED ORDER — DIPHENHYDRAMINE HCL 25 MG PO CAPS: 25.0000 mg | ORAL_CAPSULE | Freq: Four times a day (QID) | ORAL | Status: DC | PRN

## 2013-02-02 MED ORDER — PRENATAL MULTIVITAMIN CH
1.0000 | ORAL_TABLET | Freq: Every day | ORAL | Status: DC
  Administered 2013-02-02 – 2013-02-04 (×3): 1 via ORAL
  Filled 2013-02-02 (×3): qty 1

## 2013-02-02 MED ORDER — OXYTOCIN 40 UNITS IN LACTATED RINGERS INFUSION - SIMPLE MED: 62.5000 mL/h | INTRAVENOUS | Status: AC

## 2013-02-02 MED ORDER — DIPHENHYDRAMINE HCL 25 MG PO CAPS: 25.0000 mg | ORAL_CAPSULE | ORAL | Status: DC | PRN

## 2013-02-02 MED ORDER — DIPHENHYDRAMINE HCL 50 MG/ML IJ SOLN: 12.5000 mg | INTRAMUSCULAR | Status: DC | PRN

## 2013-02-02 MED ORDER — MEPERIDINE HCL 25 MG/ML IJ SOLN: 6.2500 mg | INTRAMUSCULAR | Status: DC | PRN

## 2013-02-02 MED ORDER — SIMETHICONE 80 MG PO CHEW
80.0000 mg | CHEWABLE_TABLET | ORAL | Status: DC | PRN
  Administered 2013-02-02 – 2013-02-03 (×2): 80 mg via ORAL

## 2013-02-02 MED ORDER — NALOXONE HCL 0.4 MG/ML IJ SOLN: 0.4000 mg | INTRAMUSCULAR | Status: DC | PRN

## 2013-02-02 MED ORDER — NALBUPHINE HCL 10 MG/ML IJ SOLN
5.0000 mg | INTRAMUSCULAR | Status: DC | PRN
  Filled 2013-02-02: qty 1

## 2013-02-02 MED ORDER — DIBUCAINE 1 % RE OINT: 1.0000 "application " | TOPICAL_OINTMENT | RECTAL | Status: DC | PRN

## 2013-02-02 MED ORDER — LACTATED RINGERS IV SOLN
INTRAVENOUS | Status: DC | PRN
  Administered 2013-02-01 – 2013-02-02 (×3): via INTRAVENOUS

## 2013-02-02 MED ORDER — KETOROLAC TROMETHAMINE 30 MG/ML IJ SOLN: 30.0000 mg | Freq: Four times a day (QID) | INTRAMUSCULAR | Status: AC | PRN

## 2013-02-02 MED ORDER — TETANUS-DIPHTH-ACELL PERTUSSIS 5-2.5-18.5 LF-MCG/0.5 IM SUSP: 0.5000 mL | Freq: Once | INTRAMUSCULAR | Status: DC

## 2013-02-02 MED ORDER — OXYTOCIN 40 UNITS IN LACTATED RINGERS INFUSION - SIMPLE MED
INTRAVENOUS | Status: DC | PRN
  Administered 2013-02-02: 40 [IU] via INTRAVENOUS

## 2013-02-02 MED ORDER — IBUPROFEN 600 MG PO TABS
600.0000 mg | ORAL_TABLET | Freq: Four times a day (QID) | ORAL | Status: DC
  Administered 2013-02-02 – 2013-02-04 (×9): 600 mg via ORAL
  Filled 2013-02-02 (×7): qty 1

## 2013-02-02 MED ORDER — SCOPOLAMINE 1 MG/3DAYS TD PT72
1.0000 | MEDICATED_PATCH | Freq: Once | TRANSDERMAL | Status: DC
  Administered 2013-02-02: 1.5 mg via TRANSDERMAL

## 2013-02-02 MED ORDER — CEFAZOLIN SODIUM-DEXTROSE 2-3 GM-% IV SOLR
INTRAVENOUS | Status: DC | PRN
  Administered 2013-02-01: 2 g via INTRAVENOUS

## 2013-02-02 MED ORDER — FENTANYL CITRATE 0.05 MG/ML IJ SOLN
INTRAMUSCULAR | Status: AC
  Filled 2013-02-02: qty 2

## 2013-02-02 MED ORDER — FENTANYL CITRATE 0.05 MG/ML IJ SOLN
25.0000 ug | INTRAMUSCULAR | Status: DC | PRN
  Administered 2013-02-02: 50 ug via INTRAVENOUS

## 2013-02-02 MED ORDER — KETOROLAC TROMETHAMINE 30 MG/ML IJ SOLN
30.0000 mg | Freq: Four times a day (QID) | INTRAMUSCULAR | Status: AC | PRN
  Administered 2013-02-02: 30 mg via INTRAVENOUS

## 2013-02-02 MED ORDER — SCOPOLAMINE 1 MG/3DAYS TD PT72
MEDICATED_PATCH | TRANSDERMAL | Status: AC
  Filled 2013-02-02: qty 1

## 2013-02-02 MED ORDER — OXYCODONE-ACETAMINOPHEN 5-325 MG PO TABS
1.0000 | ORAL_TABLET | ORAL | Status: DC | PRN
  Administered 2013-02-02: 1 via ORAL
  Administered 2013-02-02: 2 via ORAL
  Administered 2013-02-02: 1 via ORAL
  Administered 2013-02-03 – 2013-02-04 (×8): 2 via ORAL
  Filled 2013-02-02 (×8): qty 2
  Filled 2013-02-02: qty 1
  Filled 2013-02-02 (×2): qty 2
  Filled 2013-02-02: qty 1
  Filled 2013-02-02: qty 2

## 2013-02-02 MED ORDER — NALOXONE HCL 1 MG/ML IJ SOLN
1.0000 ug/kg/h | INTRAVENOUS | Status: DC | PRN
  Filled 2013-02-02: qty 2

## 2013-02-02 MED ORDER — SODIUM CHLORIDE 0.9 % IJ SOLN: 3.0000 mL | INTRAMUSCULAR | Status: DC | PRN

## 2013-02-02 MED ORDER — SENNOSIDES-DOCUSATE SODIUM 8.6-50 MG PO TABS
2.0000 | ORAL_TABLET | Freq: Every day | ORAL | Status: DC
  Administered 2013-02-02 – 2013-02-03 (×2): 2 via ORAL

## 2013-02-02 MED ORDER — METOCLOPRAMIDE HCL 5 MG/ML IJ SOLN: 10.0000 mg | Freq: Three times a day (TID) | INTRAMUSCULAR | Status: DC | PRN

## 2013-02-02 MED ORDER — ONDANSETRON HCL 4 MG/2ML IJ SOLN: 4.0000 mg | Freq: Three times a day (TID) | INTRAMUSCULAR | Status: DC | PRN

## 2013-02-02 MED ORDER — KETOROLAC TROMETHAMINE 30 MG/ML IJ SOLN
INTRAMUSCULAR | Status: AC
  Filled 2013-02-02: qty 1

## 2013-02-02 MED ORDER — ONDANSETRON HCL 4 MG/2ML IJ SOLN
INTRAMUSCULAR | Status: DC | PRN
  Administered 2013-02-02: 4 mg via INTRAVENOUS

## 2013-02-02 MED ORDER — SIMETHICONE 80 MG PO CHEW
80.0000 mg | CHEWABLE_TABLET | Freq: Three times a day (TID) | ORAL | Status: DC
  Administered 2013-02-02 – 2013-02-04 (×6): 80 mg via ORAL

## 2013-02-02 MED ORDER — MENTHOL 3 MG MT LOZG: 1.0000 | LOZENGE | OROMUCOSAL | Status: DC | PRN

## 2013-02-02 MED ORDER — DIPHENHYDRAMINE HCL 50 MG/ML IJ SOLN: 25.0000 mg | INTRAMUSCULAR | Status: DC | PRN

## 2013-02-02 MED ORDER — LANOLIN HYDROUS EX OINT: 1.0000 "application " | TOPICAL_OINTMENT | CUTANEOUS | Status: DC | PRN

## 2013-02-02 MED ORDER — ZOLPIDEM TARTRATE 5 MG PO TABS: 5.0000 mg | ORAL_TABLET | Freq: Every evening | ORAL | Status: DC | PRN

## 2013-02-02 MED ORDER — ONDANSETRON HCL 4 MG/2ML IJ SOLN: 4.0000 mg | INTRAMUSCULAR | Status: DC | PRN

## 2013-02-02 MED ORDER — POLYSACCHARIDE IRON COMPLEX 150 MG PO CAPS
150.0000 mg | ORAL_CAPSULE | Freq: Every day | ORAL | Status: DC
  Administered 2013-02-02 – 2013-02-04 (×3): 150 mg via ORAL
  Filled 2013-02-02 (×3): qty 1

## 2013-02-02 MED ORDER — WITCH HAZEL-GLYCERIN EX PADS: 1.0000 "application " | MEDICATED_PAD | CUTANEOUS | Status: DC | PRN

## 2013-02-02 MED ORDER — ONDANSETRON HCL 4 MG PO TABS: 4.0000 mg | ORAL_TABLET | ORAL | Status: DC | PRN

## 2013-02-02 MED ORDER — IBUPROFEN 600 MG PO TABS
600.0000 mg | ORAL_TABLET | Freq: Four times a day (QID) | ORAL | Status: DC | PRN
  Filled 2013-02-02 (×2): qty 1

## 2013-02-02 MED ORDER — LACTATED RINGERS IV SOLN
INTRAVENOUS | Status: DC
  Administered 2013-02-02: 12:00:00 via INTRAVENOUS

## 2013-02-02 NOTE — Op Note (Signed)
Rhonda Parrish 02/02/2013 Procedure Note - Primary Low Transverse Cesarean Section  Indications: Arrest of descent, more than 4 hrs of pushing with deep transverse arrest   Pre-operative Diagnosis: Arrest of desent.   Post-operative Diagnosis: Same   Surgeon: Robley Fries, MD   Assistants: D.Connye Burkitt, CNM  Anesthesia: spinal   Procedure Details:  The patient was seen in the Labor room. Arrest of descent and rotation noted after several hours of pushing and Cesarean delivery was advised. The risks, benefits, complications, treatment options, and expected outcomes were discussed with the patient. The patient concurred with the proposed plan, giving informed consent. identified as Rhonda Parrish and the procedure verified as C-Section Delivery. A Time Out was held and the above information confirmed. She received 2 gm Ancef. After induction of Spinal anesthesia, the patient was draped and prepped in the usual sterile manner and foley was placed. A Pfannenstiel Incision was made and carried down through the subcutaneous tissue to the fascia. Fascial incision was made and extended transversely. The fascia was separated from the underlying rectus tissue superiorly and inferiorly. The peritoneum was identified and entered. Peritoneal incision was extended longitudinally. The utero-vesical peritoneal reflection was incised transversely.  CNM Connye Burkitt was advised to push the head up from deep pelvic position after which a low transverse uterine incision was made. Baby BOY was delivered from cephalic presentation at 12.19 am on 02/02/13  with Apgar scores of 9 at one minute and 9 at five minutes. Delayed cord clamping done and then cord was cut and baby was handed to NICU team. Cord ph was not sent. Cord blood was obtained for evaluation. The placenta was removed Intact and appeared normal. The uterine outline, tubes and ovaries appeared normal. The uterine incision was closed with running locked sutures of 0  Monocryl followed by a second imbricating layer with excellent hemostasis. Pelvic gutters were cleaned. Peritoneum closed with 2-0 Vicryl. The fascia was then reapproximated with running sutures of 0Vicryl. The subcuticular closure was performed using 2-0plain gut. The skin was closed with 4-0Vicryl. Sterile dressing placed.  Instrument, sponge, and needle counts were correct prior the abdominal closure and were correct at the conclusion of the case.   Findings:  Baby BOY noted in ROT position, was pushed up into the lower segment by vaginal assistance by CNM. was delivered from cephalic presentation at 12.19 am on 02/02/13  with Apgar scores of 9 and 9 at 1 and 5 minutes. Normal placenta, cord. Normal tubes and ovaries.    Estimated Blood Loss: 800 cc   Total IV Fluids: 1900 ml LR    Urine Output: 400CC OF clear urine  Specimens: Cord blood. Patient wants the placenta, will be handed to them after 24 hrs per hospital policy  Complications: no complications  Disposition: PACU - hemodynamically stable.   Maternal Condition: stable   Baby condition / location:  with mother  Attending Attestation: I performed the procedure.   Signed: Surgeon(s): Robley Fries, MD

## 2013-02-02 NOTE — OR Nursing (Signed)
50 ml blood loss during fundal massage by DLWegner RN, cord blood x 2 at OR front desk

## 2013-02-02 NOTE — OR Nursing (Signed)
MOTHER WANTS PLACENTA TO TAKE HOME

## 2013-02-02 NOTE — Progress Notes (Signed)
Subjective: Postpartum Day 0: Cesarean Delivery FTD Patient reports incisional pain, tolerating PO and + flatus. Catheter in place. no complaints,  Will ambulate out of bed later today. Pain well controlled with po meds BF; on demand Mood stable, bonding well   Objective: Vital signs in last 24 hours: Temp:  [97 F (36.1 C)-99.1 F (37.3 C)] 97 F (36.1 C) (02/16 1213) Pulse Rate:  [63-104] 76 (02/16 1213) Resp:  [16-27] 20 (02/16 1213) BP: (96-127)/(44-74) 97/58 mmHg (02/16 1213) SpO2:  [96 %-100 %] 97 % (02/16 1213)  Physical Exam:  General: alert, cooperative and no distress Breasts. N/T Heart: RRR Lungs: CTAB Abdomen: BS x4, soft, N/T Uterine Fundus: firm Incision: healing well, no significant drainage, no dehiscence, no significant erythema . Clear dressing in place. Lochia: appropriate, -2/u DVT Evaluation: No evidence of DVT seen on physical exam. Negative Homan's sign. No cords or calf tenderness. No significant calf/ankle edema.   Recent Labs  02/01/13 2321 02/02/13 0557  HGB 12.3 10.2*  HCT 33.8* 28.2*   Anemia of Pregnancy - delivered  Assessment/Plan: Status post Cesarean section. Doing well postoperatively.  Continue current care. Lactation consult Commence on Niferex 150 mg po   Rhonda Parrish 02/02/2013, 1:29 PM

## 2013-02-02 NOTE — Anesthesia Postprocedure Evaluation (Signed)
  Anesthesia Post-op Note  Patient: Rhonda Parrish  Procedure(s) Performed: Procedure(s): CESAREAN SECTION (N/A)  Patient Location: PACU  Anesthesia Type:Spinal  Level of Consciousness: awake, alert  and oriented  Airway and Oxygen Therapy: Patient Spontanous Breathing  Post-op Pain: none  Post-op Assessment: Post-op Vital signs reviewed, Patient's Cardiovascular Status Stable, Respiratory Function Stable, Patent Airway, No signs of Nausea or vomiting, Pain level controlled, No headache and No backache  Post-op Vital Signs: Reviewed and stable  Complications: No apparent anesthesia complications

## 2013-02-02 NOTE — Anesthesia Postprocedure Evaluation (Signed)
  Anesthesia Post-op Note  Patient: Rhonda Parrish  Procedure(s) Performed: Procedure(s): CESAREAN SECTION (N/A)  Patient Location: PACU and Mother/Baby  Anesthesia Type:Spinal  Level of Consciousness: awake, alert  and oriented  Airway and Oxygen Therapy: Patient Spontanous Breathing  Post-op Pain: mild  Post-op Assessment: Patient's Cardiovascular Status Stable, Respiratory Function Stable, No signs of Nausea or vomiting, Adequate PO intake, Pain level controlled, No headache, No backache, No residual numbness and No residual motor weakness  Post-op Vital Signs: stable  Complications: No apparent anesthesia complications

## 2013-02-02 NOTE — Transfer of Care (Signed)
Immediate Anesthesia Transfer of Care Note  Patient: Rhonda Parrish  Procedure(s) Performed: Procedure(s): CESAREAN SECTION (N/A)  Patient Location: PACU  Anesthesia Type:Spinal  Level of Consciousness: awake, alert , oriented and patient cooperative  Airway & Oxygen Therapy: Patient Spontanous Breathing  Post-op Assessment: Report given to PACU RN and Post -op Vital signs reviewed and stable  Post vital signs: Reviewed and stable  Complications: No apparent anesthesia complications

## 2013-02-03 ENCOUNTER — Encounter (HOSPITAL_COMMUNITY): Payer: Self-pay | Admitting: Obstetrics & Gynecology

## 2013-02-03 NOTE — Progress Notes (Signed)
POSTOPERATIVE DAY # 1 S/P cesarean section   S:         Reports feeling lots of pain and soreness              Tolerating po intake / no nausea / no vomiting / no flatus / no BM             Bleeding is light             Pain controlled with motrin and percocet             Up ad lib / ambulatory/ voiding QS  Newborn breast feeding  / Circumcision planned   O:  VS: BP 93/54  Pulse 79  Temp(Src) 98.6 F (37 C) (Oral)  Resp 18  Ht 5\' 5"  (1.651 m)  Wt 71.215 kg (157 lb)  BMI 26.13 kg/m2  SpO2 97%  LMP 04/25/2012   LABS:  Recent Labs  02/01/13 2321 02/02/13 0557  WBC 21.8* 19.3*  HGB 12.3 10.2*  PLT 173 141*                 Physical Exam:             Alert and Oriented X3  Lungs: Clear and unlabored  Heart: regular rate and rhythm / no mumurs  Abdomen: soft, non-tender, non-distended, active BS             Fundus: firm, non-tender, U-1             Dressing intact              Incision:   No erythema / no ecchymosis / no drainage  Perineum: no edema  Lochia: light  Extremities: no edema, no calf pain or tenderness, negative Homans  A:        POD # 1 S/P cesarean section            Mild ABL anemia  P:        Routine postoperative care              Advance activity   Marlinda Mike CNM, MSN 02/03/2013, 0815 am

## 2013-02-03 NOTE — Progress Notes (Signed)
Ur chart review completed.  

## 2013-02-04 MED ORDER — IBUPROFEN 800 MG PO TABS: 800.0000 mg | ORAL_TABLET | Freq: Three times a day (TID) | ORAL | Status: DC | PRN

## 2013-02-04 MED ORDER — POLYSACCHARIDE IRON COMPLEX 150 MG PO CAPS: 150.0000 mg | ORAL_CAPSULE | Freq: Every day | ORAL | Status: DC

## 2013-02-04 MED ORDER — OXYCODONE-ACETAMINOPHEN 5-325 MG PO TABS: 1.0000 | ORAL_TABLET | ORAL | Status: DC | PRN

## 2013-02-04 NOTE — Discharge Summary (Signed)
POSTOPERATIVE DISCHARGE SUMMARY:  Patient ID: Rhonda Parrish MRN: [id number] DOB/AGE: 22-Dec-1980 32 y.o.  Admit date: 02/01/2013 Admission Diagnoses: 40 weeks / labor   Discharge date:  02/04/2013 Discharge Diagnoses: POD 3 s/p cesarean section / ABL anemia  Prenatal history: G2P1011   EDC : 01/30/2013, by Last Menstrual Period  Prenatal care at [facility]  Primary provider : Fredric Mare Prenatal course uncomplicated  Prenatal Labs: ABO, Rh: A (10/25 0000) positive Antibody: Negative (10/25 0000) Rubella: Immune (10/25 0000)  RPR: NON REACTIVE (02/15 2315)  HBsAg: Negative (10/25 0000)  HIV: Non-reactive (10/25 0000)  GBS: Negative (02/15 1148)  1 hr Glucola : nl  Medical / Surgical History :  Past medical history:  Past Medical History  Diagnosis Date  . No pertinent past medical history     Past surgical history:  Past Surgical History  Procedure Laterality Date  . No past surgeries    . Cesarean section N/A 02/01/2013    Procedure: CESAREAN SECTION;  Surgeon: Robley Fries, MD;  Location: [facility];  Service: Obstetrics;  Laterality: N/A;    Family History:  Family History  Problem Relation Age of Onset  . Cancer Father   . Stroke Maternal Grandmother     Social History:  reports that she has never smoked. She has never used smokeless tobacco. She reports that she does not drink alcohol or use illicit drugs.  Allergies: Review of patient's allergies indicates no known allergies.   Current Medications at time of admission:  PNV  Labor prep - EPO and blue cohosh  Intrapartum Course: admit in labor / water immersion in labor / prolonged second stage x 4 hours without fetal descent - decision for cesarean section  Procedures: Cesarean section delivery of female newborn by Dr Juliene Pina  See operative report for further details APGAR (1 MIN): 9   APGAR (5 MINS): 9    Postoperative / postpartum course: uneventful  Physical Exam:   VSS: Temp:   [98.2 F (36.8 C)-98.4 F (36.9 C)] 98.4 F (36.9 C) (02/18 0547) Pulse Rate:  [80-82] 82 (02/18 0547) Resp:  [18] 18 (02/18 0547) BP: (108-109)/(68-70) 109/70 mmHg (02/18 0547)  LABS:  Recent Labs  02/01/13 2321 02/02/13 0557  WBC 21.8* 19.3*  HGB 12.3 10.2*  PLT 173 141*    General: pleasant / ambulatory / breastfeeding established Heart:RR Lungs:clear Abdomen:soft and non-tender / uterus firm and non-tender Extremities: Trace dependent edema Dressing: intact Incision:  approximated with subcuticular / no erythema / no ecchymosis / n odrainage  Discharge Instructions:  Discharged Condition: stable Activity: pelvic rest and postoperative restrictions x 2 weeks Diet: routine Medications: see below   Medication List    STOP taking these medications       Evening Primrose Oil 1000 MG Caps      TAKE these medications       ibuprofen 800 MG tablet  Commonly known as:  ADVIL,MOTRIN  Take 1 tablet (800 mg total) by mouth every 8 (eight) hours as needed for pain.     iron polysaccharides 150 MG capsule  Commonly known as:  NIFEREX  Take 1 capsule (150 mg total) by mouth daily.     oxyCODONE-acetaminophen 5-325 MG per tablet  Commonly known as:  PERCOCET/ROXICET  Take 1-2 tablets by mouth every 4 (four) hours as needed.     prenatal multivitamin Tabs  Take 1 tablet by mouth daily.       Wound Care: dressing removal at [facility] end of  this week per patient preference Postpartum Instructions: [facility] discharge booklet - instructions reviewed Discharge to: Home  Follow up : [facility] in 6 weeks for routine postpartum visit                      Signed: Marlinda Mike CNM, MSN 02/04/2013, 10:10 AM

## 2013-02-04 NOTE — Progress Notes (Signed)
POSTOPERATIVE DAY # 3 S/P cesarean section   S:         Reports feeling well- better today             Tolerating po intake / no nausea / no vomiting / + flatus / no BM             Bleeding is light             Pain controlled with motrin and percocet             Up ad lib / ambulatory/ voiding QS  Newborn breast feeding  / Circumcision done  O:  VS: BP 109/70  Pulse 82  Temp(Src) 98.4 F (36.9 C) (Oral)  Resp 18  Ht 5\' 5"  (1.651 m)  Wt 71.215 kg (157 lb)  BMI 26.13 kg/m2  SpO2 97%  LMP 04/25/2012   LABS:  Recent Labs  02/01/13 2321 02/02/13 0557  WBC 21.8* 19.3*  HGB 12.3 10.2*  PLT 173 141*                           I&O: Intake/Output     02/17 0701 - 02/18 0700 02/18 0701 - 02/19 0700   Urine (mL/kg/hr)     Total Output       Net                         Physical Exam:             Alert and Oriented X3  Lungs: Clear and unlabored  Heart: regular rate and rhythm / no mumurs  Abdomen: soft, non-tender, non-distended, active BS             Fundus: firm, non-tender, U-1             Dressing intact              Incision:  No erythema / no  ecchymosis / no drainage  Perineum: no edema  Lochia: light  Extremities: trace edema, no calf pain or tenderness  A:        POD # 3 S/P cesarean section             P:        Routine postoperative care              Discharge home - return to [facility] end of week for dressing removal / interval CNM visit             [facility] booklet - instructions reviewed    Marlinda Mike CNM, MSN 02/04/2013, 10:04 AM

## 2013-02-04 NOTE — Discharge Summary (Signed)
Reviewed and agree with note and plan. V.Lanier Felty, MD  

## 2013-02-14 ENCOUNTER — Telehealth (HOSPITAL_COMMUNITY): Payer: Self-pay | Admitting: *Deleted

## 2013-02-14 NOTE — Telephone Encounter (Signed)
Resolve episode 

## 2014-10-19 ENCOUNTER — Encounter (HOSPITAL_COMMUNITY): Payer: Self-pay | Admitting: Obstetrics & Gynecology

## 2014-12-18 NOTE — L&D Delivery Note (Signed)
Delivery Note  First Stage: Labor onset: 0300 Augmentation : none Analgesia /Anesthesia intrapartum: none SROM at 0245  Second Stage: Complete dilation at 0550 Onset of pushing at 0610 FHR second stage category 1 - graphing maternal pulse at intervals due to maternal position in hands-knees  Delivery of a viable female at [id number] by CNM in LOA position Loose nuchal cord x1 Cord double clamped after cessation of pulsation, cut by FOB Cord blood sample collected    Third Stage: Placenta delivered [facility] intact with 3 VC @ [phone] Placenta disposition: home with patient - plans encapsulation Uterine tone boggy / bleeding brisk - uterine massage / cytotec 800mcg per rectum  2nd laceration identified  Anesthesia for repair: 1% xylocaine Repair 3-0 vicryl with deep muscle re-approximation / 3-0 vicryl vaginal repair / 4-0 vicryl subcuticular Est. Blood Loss (mL): 300  Complications: none  Mom to postpartum.  Baby to Couplet care / Skin to Skin.  Newborn: Birth Weight: 6 pounds - 10 ounces Apgar Scores: 8-9 Feeding planned: breast  Marlinda MikeBAILEY, Nakea Gouger CNM, MSN, FACNM 10/26/2015, 7:56 AM

## 2015-10-02 ENCOUNTER — Encounter (HOSPITAL_COMMUNITY): Payer: Self-pay | Admitting: *Deleted

## 2015-10-02 ENCOUNTER — Inpatient Hospital Stay (HOSPITAL_COMMUNITY)
Admission: AD | Admit: 2015-10-02 | Discharge: 2015-10-03 | Disposition: A | Discharge status: Home / Self Care | Payer: BLUE CROSS/BLUE SHIELD | Source: Ambulatory Visit | Attending: Obstetrics & Gynecology | Admitting: Obstetrics & Gynecology

## 2015-10-02 DIAGNOSIS — O34219 Maternal care for unspecified type scar from previous cesarean delivery: Secondary | ICD-10-CM | POA: Insufficient documentation

## 2015-10-02 DIAGNOSIS — Z3A35 35 weeks gestation of pregnancy: Secondary | ICD-10-CM | POA: Insufficient documentation

## 2015-10-02 DIAGNOSIS — N898 Other specified noninflammatory disorders of vagina: Secondary | ICD-10-CM | POA: Insufficient documentation

## 2015-10-02 DIAGNOSIS — O26893 Other specified pregnancy related conditions, third trimester: Secondary | ICD-10-CM | POA: Insufficient documentation

## 2015-10-02 HISTORY — DX: Other specified health status: Z78.9

## 2015-10-02 NOTE — MAU Note (Signed)
Went to BR about 2200 and saw greenish/yellow mucousy d/c on tissue. Went to [facility] about later and had BM and saw more yellow green droplets in toilet. Denies LOF. No pain

## 2015-10-03 ENCOUNTER — Encounter (HOSPITAL_COMMUNITY): Payer: Self-pay | Admitting: *Deleted

## 2015-10-03 DIAGNOSIS — O26893 Other specified pregnancy related conditions, third trimester: Secondary | ICD-10-CM | POA: Diagnosis not present

## 2015-10-03 DIAGNOSIS — N898 Other specified noninflammatory disorders of vagina: Secondary | ICD-10-CM | POA: Diagnosis present

## 2015-10-03 DIAGNOSIS — Z3A35 35 weeks gestation of pregnancy: Secondary | ICD-10-CM | POA: Diagnosis not present

## 2015-10-03 DIAGNOSIS — O34219 Maternal care for unspecified type scar from previous cesarean delivery: Secondary | ICD-10-CM | POA: Diagnosis not present

## 2015-10-03 LAB — URINALYSIS W MICROSCOPIC (NOT AT ARMC)
BILIRUBIN URINE: NEGATIVE
Glucose, UA: NEGATIVE mg/dL
HGB URINE DIPSTICK: NEGATIVE
KETONES UR: NEGATIVE mg/dL
LEUKOCYTES UA: NEGATIVE
NITRITE: NEGATIVE
Protein, ur: NEGATIVE mg/dL
SPECIFIC GRAVITY, URINE: 1.01 (ref 1.005–1.030)
UROBILINOGEN UA: 0.2 mg/dL (ref 0.0–1.0)
pH: 5.5 (ref 5.0–8.0)

## 2015-10-03 LAB — AMNISURE RUPTURE OF MEMBRANE (ROM) NOT AT ARMC: AMNISURE: NEGATIVE

## 2015-10-03 NOTE — Progress Notes (Signed)
Rhonda Parrish CNM in and discussing d/c plan with pt. Written and verbal d/c instructions given and understanding voiced.

## 2015-10-03 NOTE — MAU Note (Signed)
Esign not working in [employer] #2. PT signed hard copy

## 2015-10-03 NOTE — Discharge Instructions (Signed)

## 2015-10-03 NOTE — MAU Provider Note (Signed)
History     CSN: [id number]  Arrival date and time: 10/02/15 2339  Orders placed in EPIC: 2349 Provider notified: 0110 Provider at bedside: 0115    Chief Complaint  Patient presents with  . Vaginal Discharge   HPI  Ms. Rhonda Parrish is a 34 yo G3P1011 female at 35.[redacted] wks gestation by LMP, presenting tonight with c/o "leaking yellowish-green vaginal discharge".  She is not sure if it is her water that broke, but she has not had to wear any peripads for the d/c.  She only sees the d/c with trips to the BR.  Denies VB or UC's. (+) FM.  Has an appt 10/19 with Tanya.  Her primary OB provider.  Her prenatal care is significant for: android pelvis, narrow pubic arch, h/o previous c/s, planning TOLAC.  Past Medical History  Diagnosis Date  . No pertinent past medical history   . Medical history non-contributory     Past Surgical History  Procedure Laterality Date  . Cesarean section N/A 02/01/2013    Procedure: CESAREAN SECTION;  Surgeon: Robley Fries, MD;  Location: [facility];  Service: Obstetrics;  Laterality: N/A;    Family History  Problem Relation Age of Onset  . Cancer Father   . Stroke Maternal Grandmother     Social History  Substance Use Topics  . Smoking status: Never Smoker   . Smokeless tobacco: Never Used  . Alcohol Use: No    Allergies: No Known Allergies  Prescriptions prior to admission  Medication Sig Dispense Refill Last Dose  . Docosahexaenoic Acid (DHA) 200 MG CAPS Take by mouth.   10/02/2015 at Unknown time  . magnesium 30 MG tablet Take 200 mg by mouth daily.   10/02/2015 at Unknown time  . Prenatal Vit-Fe Fumarate-FA (PRENATAL MULTIVITAMIN) TABS Take 1 tablet by mouth daily.   10/02/2015 at Unknown time  . ibuprofen (ADVIL,MOTRIN) 800 MG tablet Take 1 tablet (800 mg total) by mouth every 8 (eight) hours as needed for pain. 30 tablet 0   . iron polysaccharides (NIFEREX) 150 MG capsule Take 1 capsule (150 mg total) by mouth daily. 30 capsule 0   .  oxyCODONE-acetaminophen (PERCOCET/ROXICET) 5-325 MG per tablet Take 1-2 tablets by mouth every 4 (four) hours as needed. 30 tablet 0     Review of Systems  Constitutional: Negative.   HENT: Negative.   Eyes: Negative.   Cardiovascular: Negative.   Gastrointestinal: Negative.   Genitourinary:       Yellowish-green vaginal discharge noted with wiping and some in the toilet  Musculoskeletal: Negative.   Skin: Negative.   Neurological: Negative.   Endo/Heme/Allergies: Negative.   Psychiatric/Behavioral: Negative.    Results for orders placed or performed during the hospital encounter of 10/02/15 (from the past 24 hour(s))  Urinalysis with microscopic     Status: Abnormal   Collection Time: 10/03/15 12:05 AM  Result Value Ref Range   Color, Urine YELLOW YELLOW   APPearance CLEAR CLEAR   Specific Gravity, Urine 1.010 1.005 - 1.030   pH 5.5 5.0 - 8.0   Glucose, UA NEGATIVE NEGATIVE mg/dL   Hgb urine dipstick NEGATIVE NEGATIVE   Bilirubin Urine NEGATIVE NEGATIVE   Ketones, ur NEGATIVE NEGATIVE mg/dL   Protein, ur NEGATIVE NEGATIVE mg/dL   Urobilinogen, UA 0.2 0.0 - 1.0 mg/dL   Nitrite NEGATIVE NEGATIVE   Leukocytes, UA NEGATIVE NEGATIVE   WBC, UA 0-2 <3 WBC/hpf   Bacteria, UA RARE RARE   Squamous Epithelial / LPF FEW (A)  RARE   Urine-Other MUCOUS PRESENT   [facility]     Status: None   Collection Time: 10/03/15 12:25 AM  Result Value Ref Range   Amnisure ROM NEGATIVE    CEFM  FHR: 155 bpm / moderate variability / accels present / decels absent TOCO: irregular UC's with UI  Physical Exam   Blood pressure 104/66, pulse 84, temperature 97.7 F (36.5 C), resp. rate 18, height 5\' 6"  (1.676 m), weight 69.128 kg (152 lb 6.4 oz).  Physical Exam  Constitutional: She is oriented to person, place, and time. She appears well-developed and well-nourished.  HENT:  Head: Normocephalic and atraumatic.  Eyes: Pupils are equal, round, and reactive to  light.  Neck: Normal range of motion.  Cardiovascular: Normal rate, regular rhythm, normal heart sounds and intact distal pulses.   Respiratory: Effort normal and breath sounds normal.  GI: Soft. Bowel sounds are normal.  Genitourinary: Vagina normal and uterus normal.  Spec exam: small amount of very light yellowish green mucoid d/s seen coming from cervical os; easily wiped away with cotton swabs; no active leaking; Neg pooling  Musculoskeletal: Normal range of motion.  Neurological: She is alert and oriented to person, place, and time. She has normal reflexes.  Skin: Skin is dry.  Psychiatric: She has a normal mood and affect. Her behavior is normal. Judgment and thought content normal.    MAU Course  Procedures [place] Amnisure CEFM  Assessment and Plan  35.[redacted] wks gestation Vaginal discharge Category 1 FHR tracing  Discharge home Call the office, if suspects leaking fluid or abnormal vaginal discharrge  Rhonda GowerDAWSON, Rhonda Parrish, M MSN, CNM 10/03/2015, 1:30 AM

## 2015-10-03 NOTE — Progress Notes (Signed)
Raelyn Moraolitta Dawson CNM notified of pt's admission and status. Aware of u/a results, neg Amnisure, no green vag d/c noted since admission. CNM will see pt

## 2015-10-26 ENCOUNTER — Inpatient Hospital Stay (HOSPITAL_COMMUNITY)
Admission: AD | Admit: 2015-10-26 | Discharge: 2015-10-28 | DRG: 775 | Disposition: A | Discharge status: Home / Self Care | Payer: BLUE CROSS/BLUE SHIELD | Source: Ambulatory Visit | Attending: Obstetrics and Gynecology | Admitting: Obstetrics and Gynecology

## 2015-10-26 ENCOUNTER — Encounter (HOSPITAL_COMMUNITY): Payer: Self-pay | Admitting: *Deleted

## 2015-10-26 DIAGNOSIS — F419 Anxiety disorder, unspecified: Secondary | ICD-10-CM | POA: Diagnosis present

## 2015-10-26 DIAGNOSIS — Z809 Family history of malignant neoplasm, unspecified: Secondary | ICD-10-CM

## 2015-10-26 DIAGNOSIS — Z3A38 38 weeks gestation of pregnancy: Secondary | ICD-10-CM

## 2015-10-26 DIAGNOSIS — O34211 Maternal care for low transverse scar from previous cesarean delivery: Secondary | ICD-10-CM | POA: Diagnosis present

## 2015-10-26 DIAGNOSIS — O99824 Streptococcus B carrier state complicating childbirth: Secondary | ICD-10-CM | POA: Diagnosis present

## 2015-10-26 DIAGNOSIS — Z823 Family history of stroke: Secondary | ICD-10-CM | POA: Diagnosis not present

## 2015-10-26 DIAGNOSIS — O34219 Maternal care for unspecified type scar from previous cesarean delivery: Secondary | ICD-10-CM | POA: Diagnosis not present

## 2015-10-26 DIAGNOSIS — O99344 Other mental disorders complicating childbirth: Secondary | ICD-10-CM | POA: Diagnosis present

## 2015-10-26 DIAGNOSIS — IMO0001 Reserved for inherently not codable concepts without codable children: Secondary | ICD-10-CM

## 2015-10-26 LAB — OB RESULTS CONSOLE GBS: GBS: POSITIVE

## 2015-10-26 MED ORDER — WITCH HAZEL-GLYCERIN EX PADS
1.0000 "application " | MEDICATED_PAD | CUTANEOUS | Status: DC | PRN
  Administered 2015-10-26 – 2015-10-28 (×2): 1 via TOPICAL

## 2015-10-26 MED ORDER — BENZOCAINE-MENTHOL 20-0.5 % EX AERO
1.0000 "application " | INHALATION_SPRAY | CUTANEOUS | Status: DC | PRN
  Administered 2015-10-26 – 2015-10-28 (×2): 1 via TOPICAL
  Filled 2015-10-26 (×2): qty 56

## 2015-10-26 MED ORDER — MISOPROSTOL 200 MCG PO TABS
ORAL_TABLET | ORAL | Status: AC
  Filled 2015-10-26: qty 4

## 2015-10-26 MED ORDER — OXYCODONE-ACETAMINOPHEN 5-325 MG PO TABS: 2.0000 | ORAL_TABLET | ORAL | Status: DC | PRN

## 2015-10-26 MED ORDER — ACETAMINOPHEN 325 MG PO TABS: 650.0000 mg | ORAL_TABLET | ORAL | Status: DC | PRN

## 2015-10-26 MED ORDER — OXYCODONE-ACETAMINOPHEN 5-325 MG PO TABS
1.0000 | ORAL_TABLET | ORAL | Status: DC | PRN
  Administered 2015-10-26: 1 via ORAL
  Filled 2015-10-26: qty 1

## 2015-10-26 MED ORDER — OXYTOCIN 10 UNIT/ML IJ SOLN
INTRAMUSCULAR | Status: AC
  Administered 2015-10-26: 10 [IU]
  Filled 2015-10-26: qty 1

## 2015-10-26 MED ORDER — MISOPROSTOL 200 MCG PO TABS
800.0000 ug | ORAL_TABLET | Freq: Once | ORAL | Status: AC
  Administered 2015-10-26: 800 ug via RECTAL

## 2015-10-26 MED ORDER — LANOLIN HYDROUS EX OINT: TOPICAL_OINTMENT | CUTANEOUS | Status: DC | PRN

## 2015-10-26 MED ORDER — DIBUCAINE 1 % RE OINT: 1.0000 "application " | TOPICAL_OINTMENT | RECTAL | Status: DC | PRN

## 2015-10-26 MED ORDER — LIDOCAINE HCL (PF) 1 % IJ SOLN
30.0000 mL | INTRAMUSCULAR | Status: DC | PRN
  Administered 2015-10-26: 30 mL via SUBCUTANEOUS
  Filled 2015-10-26: qty 30

## 2015-10-26 MED ORDER — HYDROCORTISONE ACE-PRAMOXINE 1-1 % RE FOAM
1.0000 | Freq: Three times a day (TID) | RECTAL | Status: DC
  Administered 2015-10-26 – 2015-10-28 (×6): 1 via RECTAL
  Filled 2015-10-26 (×2): qty 10

## 2015-10-26 MED ORDER — HYDROCORTISONE ACE-PRAMOXINE 2.5-1 % RE CREA: TOPICAL_CREAM | Freq: Three times a day (TID) | RECTAL | Status: DC

## 2015-10-26 MED ORDER — OXYCODONE-ACETAMINOPHEN 5-325 MG PO TABS
1.0000 | ORAL_TABLET | ORAL | Status: DC | PRN
Start: 2015-10-26 — End: 2015-10-26

## 2015-10-26 MED ORDER — IBUPROFEN 600 MG PO TABS
600.0000 mg | ORAL_TABLET | Freq: Four times a day (QID) | ORAL | Status: DC
  Administered 2015-10-26 – 2015-10-28 (×9): 600 mg via ORAL
  Filled 2015-10-26 (×9): qty 1

## 2015-10-26 MED ORDER — LIDOCAINE HCL (PF) 1 % IJ SOLN
INTRAMUSCULAR | Status: AC
  Administered 2015-10-26: 30 mL via SUBCUTANEOUS
  Filled 2015-10-26: qty 30

## 2015-10-26 MED ORDER — ONDANSETRON HCL 4 MG/2ML IJ SOLN: 4.0000 mg | Freq: Four times a day (QID) | INTRAMUSCULAR | Status: DC | PRN

## 2015-10-26 MED ORDER — CITRIC ACID-SODIUM CITRATE 334-500 MG/5ML PO SOLN: 30.0000 mL | ORAL | Status: DC | PRN

## 2015-10-26 MED ORDER — SIMETHICONE 80 MG PO CHEW: 80.0000 mg | CHEWABLE_TABLET | ORAL | Status: DC | PRN

## 2015-10-26 MED ORDER — MISOPROSTOL 200 MCG PO TABS
ORAL_TABLET | ORAL | Status: AC
  Administered 2015-10-26: 800 ug via RECTAL
  Filled 2015-10-26: qty 4

## 2015-10-26 NOTE — Lactation Note (Signed)
This note was copied from the chart of Rhonda Asher MuirMelissa Guerrero. Lactation Consultation Note  Patient Name: Rhonda Asher MuirMelissa Bula RUEAV'WToday's Date: 10/26/2015  baby is 6 hours old and has been to the breast several times since birth.  Baby  presently latched  On the right breast with depth , multiply swallows noted  And mom comfortable. This mom is an experienced breast feeder and per mom  Stopped breast feeding 6 1/2 months ago.  Mom is trying to drink a lot fluids [facility]  Mother informed of post-discharge support and given phone number to the lactation department,  including services for phone call assistance; out-patient appointments; and breastfeeding support group.  List of other breastfeeding resources in the community given in the handout. Encouraged mother to call for  problems or concerns related to breastfeeding.   Maternal Data Does the patient have breastfeeding experience prior to this delivery?: Yes  Feeding Feeding Type: Breast Fed Length of feed:  (still feeding at 8 mins with multiply swallows )  LATCH Score/Interventions Latch:  (latched with depth )  Audible Swallowing:  (multiply swallows )     Comfort (Breast/Nipple):  (per mom comfortable )     Hold (Positioning):  (mom independent with latch )     Lactation Tools Discussed/Used     Consult Status Consult Status: Follow-up Date: 10/27/15 Follow-up type: In-patient    Kathrin Greathouseorio, Daryel Kenneth Ann 10/26/2015, 1:21 PM

## 2015-10-26 NOTE — Care Management Important Message (Signed)
12 noon,Nursing tech assisting patient to bathroom when pt. Became very light headed and started to pass out on the toilet. Emergency assist with ammonia after passing out and immediately arousing assisting to stedy and to bed, able to transfer.  Encouraging fluids. 2p assisted with stedy again becoming light headed and needing ammonia to stay alert. Voided well. 4p assisted with stedy, no lightheadedness. Voiding well. Eating and drinking well.

## 2015-10-26 NOTE — MAU Note (Signed)
Contractions and LOF.

## 2015-10-26 NOTE — H&P (Signed)
  OB ADMISSION/ HISTORY & PHYSICAL:  Admission Date: 10/26/2015  5:46 AM  Admit Diagnosis: 38.2 weeks / active labor / previous cesarean section for TOLAC  Rhonda Parrish is a 34 y.o. female presenting for active labor - imminent delivery.  Prenatal History: [id number]   EDC : 11/07/2015, Date entered prior to episode creation  [facility]  Primary Ob Provider: Fredric MareBailey CNM Prenatal course complicated by previous cesarean section / anxiety disorder / + GBS  Prenatal Labs: ABO, Rh:  A positive Antibody:  negative Rubella:   Immune RPR:   NR HBsAg:   Negative HIV:   NR GTT: NL GBS: Positive (11/08 [id number])   Medical / Surgical History :  Past medical history:  Past Medical History  Diagnosis Date  . No pertinent past medical history   . Medical history non-contributory      Past surgical history:  Past Surgical History  Procedure Laterality Date  . Cesarean section N/A 02/01/2013    Procedure: CESAREAN SECTION;  Surgeon: Robley FriesVaishali R Mody, MD;  Location: [facility];  Service: Obstetrics;  Laterality: N/A;    Family History:  Family History  Problem Relation Age of Onset  . Cancer Father   . Stroke Maternal Grandmother      Social History:  reports that she has never smoked. She has never used smokeless tobacco. She reports that she does not drink alcohol or use illicit drugs.  Allergies: Review of patient's allergies indicates no known allergies.   Current Medications at time of admission:  Prior to Admission medications   Medication Sig Start Date End Date Taking? Authorizing Provider  Docosahexaenoic Acid (DHA) 200 MG CAPS Take by mouth.    Historical Provider, MD  magnesium 30 MG tablet Take 200 mg by mouth daily.    Historical Provider, MD  Prenatal Vit-Fe Fumarate-FA (PRENATAL MULTIVITAMIN) TABS Take 1 tablet by mouth daily.    Historical Provider, MD   Review of Systems: Active FM onset of ctx @ 0300 LOF  / SROM @ 0245 bloody show  present  Physical Exam:  VS: Blood pressure 113/74, pulse 125, height 5\' 6"  (1.676 m), weight 69.854 kg (154 lb), unknown if currently breastfeeding.  General: alert and oriented, appears uncomfortable - hands and knee position in bed - crowning with ctx Heart: RRR Lungs: Clear lung fields Abdomen: Gravid, soft and non-tender, non-distended / uterus: gravid Extremities: no edema  Genitalia / VE: Dilation: 10 Effacement (%): 100 Station: +3 Exam by:: T Bailey CNM  FHR: baseline rate 125 / variability moderate / accelerations + / no decelerations TOCO: Q 5 minutes  Assessment: 38.[redacted] weeks gestation Second stage of labor FHR category 1 + GBS status   Plan:  Admit TOLAC requested with imminent delivery [facility]  Dr Cherly Hensenousins notified of admission    Marlinda MikeBAILEY, TANYA CNM, MSN, [facility] 10/26/2015, [id number]

## 2015-10-27 LAB — RPR: RPR: NONREACTIVE

## 2015-10-27 LAB — CBC
HCT: 27.1 % — ABNORMAL LOW (ref 36.0–46.0)
Hemoglobin: 9.6 g/dL — ABNORMAL LOW (ref 12.0–15.0)
MCH: 33.2 pg (ref 26.0–34.0)
MCHC: 35.4 g/dL (ref 30.0–36.0)
MCV: 93.8 fL (ref 78.0–100.0)
Platelets: 175 10*3/uL (ref 150–400)
RBC: 2.89 MIL/uL — ABNORMAL LOW (ref 3.87–5.11)
RDW: 13.6 % (ref 11.5–15.5)
WBC: 13.5 10*3/uL — ABNORMAL HIGH (ref 4.0–10.5)

## 2015-10-27 MED ORDER — SENNOSIDES-DOCUSATE SODIUM 8.6-50 MG PO TABS
2.0000 | ORAL_TABLET | Freq: Every day | ORAL | Status: DC
  Administered 2015-10-27: 2 via ORAL
  Filled 2015-10-27: qty 2

## 2015-10-27 MED ORDER — SENNA 8.6 MG PO TABS: 2.0000 | ORAL_TABLET | Freq: Every day | ORAL | Status: DC

## 2015-10-27 NOTE — Progress Notes (Signed)
PPD 1 SVD  S:  Reports feeling well - sore but no bad pain             Tolerating po/ No nausea or vomiting             Bleeding is light             Pain controlled with motrin mostly with occasional percocet             Up ad lib / ambulatory / voiding QS  Newborn breast feeding  O:               VS: BP 95/62 mmHg  Pulse 82  Temp(Src) 97.6 F (36.4 C) (Oral)  Resp 18  Ht 5\' 6"  (1.676 m)  Wt 69.854 kg (154 lb)  BMI 24.87 kg/m2  SpO2 99%  Breastfeeding? Unknown   LABS:              Recent Labs  10/27/15 0545  WBC 13.5*  HGB 9.6*  PLT 175                    Physical Exam:             Alert and oriented X3  Lungs: Clear and unlabored  Heart: regular rate and rhythm / no mumurs  Abdomen: soft, non-tender, non-distended              Fundus: firm, non-tender, U-1  Perineum: moderate edema  Lochia: light  Extremities: no edema, no calf pain or tenderness    A: PPD # 1   Doing well - stable status  P: Routine post partum orders  Anticipate DC tomorrow - Peds DC of newborn after 48 hr due to no ABX intrapartum  Marlinda MikeBAILEY, Rhonda Parrish CNM, MSN, FACNM 10/27/2015, 8:23 AM

## 2015-10-27 NOTE — Progress Notes (Signed)
Pt requesting stool softeners  Wiliam Ke. Bailey CNM notified  Orders received

## 2015-10-27 NOTE — Lactation Note (Signed)
This note was copied from the chart of Rhonda Asher MuirMelissa Dillie. Lactation Consultation Note  Baby latched upon entering.  A few swallows observed. Encouraged mother to continue hand express before latching and breastfeed on both breasts  Burping in between. Discussed cluster feeding and be sure to latch baby deep on breast. Reviewed engorgement care and monitoring voids/stools.   Patient Name: Rhonda Parrish ZOXWR'UToday's Date: 10/27/2015 Reason for consult: Follow-up assessment   Maternal Data Does the patient have breastfeeding experience prior to this delivery?: Yes  Feeding Feeding Type: Breast Fed Length of feed: 15 min  LATCH Score/Interventions Latch: Grasps breast easily, tongue down, lips flanged, rhythmical sucking. (latched upon entering)  Audible Swallowing: A few with stimulation Intervention(s): Alternate breast massage  Type of Nipple: Everted at rest and after stimulation  Comfort (Breast/Nipple): Soft / non-tender     Hold (Positioning): No assistance needed to correctly position infant at breast. Intervention(s): Support Pillows  LATCH Score: 9  Lactation Tools Discussed/Used     Consult Status Consult Status: Complete    Hardie PulleyBerkelhammer, Sheron Tallman Boschen 10/27/2015, 11:39 AM

## 2015-10-27 NOTE — Plan of Care (Signed)
Problem: Activity: Goal: Ability to tolerate increased activity will improve Patient reluctant to ambulate. Up with assistance -stand by does well. Improving and going by herself gaining independence with self care

## 2015-10-27 NOTE — Progress Notes (Signed)
Patient requests order for stool softener.

## 2015-10-28 MED ORDER — IBUPROFEN 600 MG PO TABS: 600.0000 mg | ORAL_TABLET | Freq: Four times a day (QID) | ORAL | Status: DC

## 2015-10-28 MED ORDER — HYDROCORTISONE ACE-PRAMOXINE 1-1 % RE FOAM: 1.0000 | Freq: Three times a day (TID) | RECTAL | Status: DC

## 2015-10-28 NOTE — Discharge Instructions (Signed)
Breast Pumping Tips °If you are breastfeeding, there may be times when you cannot feed your baby directly. Returning to work or going on a trip are common examples. Pumping allows you to store breast milk and feed it to your baby later.  °You may not get much milk when you first start to pump. Your breasts should start to make more after a few days. If you pump at the times you usually feed your baby, you may be able to keep making enough milk to feed your baby without also using formula. The more often you pump, the more milk you will produce.  °WHEN SHOULD I PUMP?  °· You can begin to pump soon after delivery. However, some experts recommend waiting about 4 weeks before giving your infant a bottle to make sure breastfeeding is going well.  °· If you plan to return to work, begin pumping a few weeks before. This will help you develop techniques that work best for you. It also lets you build up a supply of breast milk.   °· When you are with your infant, feed on demand and pump after each feeding.   °· When you are away from your infant for several hours, pump for about 15 minutes every 2-3 hours. Pump both breasts at the same time if you can.   °· If your infant has a formula feeding, make sure to pump around the same time.     °· If you drink any alcohol, wait 2 hours before pumping.   °HOW DO I PREPARE TO PUMP? °Your let-down reflex is the natural reaction to stimulation that makes your breast milk flow. It is easier to stimulate this reflex when you are relaxed. Find relaxation techniques that work for you. If you have difficulty with your let-down reflex, try these methods:  °· Smell one of your infant's blankets or an item of clothing.   °· Look at a picture or video of your infant.   °· Sit in a quiet, private space.   °· Massage the breast you plan to pump.   °· Place soothing warmth on the breast.   °· Play relaxing music.   °WHAT ARE SOME GENERAL BREAST PUMPING TIPS? °· Wash your hands before you pump. You  do not need to wash your nipples or breasts. °· There are three ways to pump. °¨ You can use your hand to massage and compress your breast. °¨ You can use a handheld manual pump. °¨ You can use an electric pump.   °· Make sure the suction cup (flange) on the breast pump is the right size. Place the flange directly over the nipple. If it is the wrong size or placed the wrong way, it may be painful and cause nipple damage.   °· If pumping is uncomfortable, apply a small amount of purified or modified lanolin to your nipple and areola. °· If you are using an electric pump, adjust the speed and suction power to be more comfortable. °· If pumping is painful or if you are not getting very much milk, you may need a different type of pump. A lactation consultant can help you determine what type of pump to use.   °· Keep a full water bottle near you at all times. Drinking lots of fluid helps you make more milk.  °· You can store your milk to use later. Pumped breast milk can be stored in a sealable, sterile container or plastic bag. Label all stored breast milk with the date you pumped it. °¨ Milk can stay out at room temperature for up to 8 hours. °¨   You can store your milk in the refrigerator for up to 8 days. °¨ You can store your milk in the freezer for 3 months. Thaw frozen milk using warm water. Do not put it in the microwave. °· Do not smoke. Smoking can lower your milk supply and harm your infant. If you need help quitting, ask your health care provider to recommend a program.   °WHEN SHOULD I CALL MY HEALTH CARE PROVIDER OR A LACTATION CONSULTANT? °· You are having trouble pumping. °· You are concerned that you are not making enough milk. °· You have nipple pain, soreness, or redness. °· You want to use birth control. Birth control pills may lower your milk supply. Talk to your health care provider about your options. °  °This information is not intended to replace advice given to you by your health care provider.  Make sure you discuss any questions you have with your health care provider. °  °Document Released: 05/24/2010 Document Revised: 12/09/2013 Document Reviewed: 09/26/2013 °[employer]vier Interactive Patient Education ©2016 [employer] °Postpartum Depression and Baby Blues °The postpartum period begins right after the birth of a baby. During this time, there is often a great amount of joy and excitement. It is also a time of many changes in the life of the parents. Regardless of how many times a mother gives birth, each child brings new challenges and dynamics to the family. It is not unusual to have feelings of excitement along with confusing shifts in moods, emotions, and thoughts. All mothers are at risk of developing postpartum depression or the "baby blues." These mood changes can occur right after giving birth, or they may occur many months after giving birth. The baby blues or postpartum depression can be mild or severe. Additionally, postpartum depression can go away rather quickly, or it can be a long-term condition.  °CAUSES °Raised hormone levels and the rapid drop in those levels are thought to be a main cause of postpartum depression and the baby blues. A number of hormones change during and after pregnancy. Estrogen and progesterone usually decrease right after the delivery of your baby. The levels of thyroid hormone and various cortisol steroids also rapidly drop. Other factors that play a role in these mood changes include major life events and genetics.  °RISK FACTORS °If you have any of the following risks for the baby blues or postpartum depression, know what symptoms to watch out for during the postpartum period. Risk factors that may increase the likelihood of getting the baby blues or postpartum depression include: °· Having a personal or family history of depression.   °· Having depression while being pregnant.   °· Having premenstrual mood issues or mood issues related to oral  contraceptives. °· Having a lot of life stress.   °· Having marital conflict.   °· Lacking a social support network.   °· Having a baby with special needs.   °· Having health problems, such as diabetes.   °SIGNS AND SYMPTOMS °Symptoms of baby blues include: °· Brief changes in mood, such as going from extreme happiness to sadness. °· Decreased concentration.   °· Difficulty sleeping.   °· Crying spells, tearfulness.   °· Irritability.   °· Anxiety.   °Symptoms of postpartum depression typically begin within the first month after giving birth. These symptoms include: °· Difficulty sleeping or excessive sleepiness.   °· Marked weight loss.   °· Agitation.   °· Feelings of worthlessness.   °· Lack of interest in activity or food.   °Postpartum psychosis is a very serious condition and can be dangerous. Fortunately, it is   rare. Displaying any of the following symptoms is cause for immediate medical attention. Symptoms of postpartum psychosis include:  °· Hallucinations and delusions.   °· Bizarre or disorganized behavior.   °· Confusion or disorientation.   °DIAGNOSIS  °A diagnosis is made by an evaluation of your symptoms. There are no medical or lab tests that lead to a diagnosis, but there are various questionnaires that a health care provider may use to identify those with the baby blues, postpartum depression, or psychosis. Often, a screening tool called the Edinburgh Postnatal Depression Scale is used to diagnose depression in the postpartum period.  °TREATMENT °The baby blues usually goes away on its own in 1-2 weeks. Social support is often all that is needed. You will be encouraged to get adequate sleep and rest. Occasionally, you may be given medicines to help you sleep.  °Postpartum depression requires treatment because it can last several months or longer if it is not treated. Treatment may include individual or group therapy, medicine, or both to address any social, physiological, and psychological factors  that may play a role in the depression. Regular exercise, a healthy diet, rest, and social support may also be strongly recommended.  °Postpartum psychosis is more serious and needs treatment right away. Hospitalization is often needed. °HOME CARE INSTRUCTIONS °· Get as much rest as you can. Nap when the baby sleeps.   °· Exercise regularly. Some women find yoga and walking to be beneficial.   °· Eat a balanced and nourishing diet.   °· Do little things that you enjoy. Have a cup of tea, take a bubble bath, read your favorite magazine, or listen to your favorite music. °· Avoid alcohol.   °· Ask for help with household chores, cooking, grocery shopping, or running errands as needed. Do not try to do everything.   °· Talk to people close to you about how you are feeling. Get support from your partner, family members, friends, or other new moms. °· Try to stay positive in how you think. Think about the things you are grateful for.   °· Do not spend a lot of time alone.   °· Only take over-the-counter or prescription medicine as directed by your health care provider. °· Keep all your postpartum appointments.   °· Let your health care provider know if you have any concerns.   °SEEK MEDICAL CARE IF: °You are having a reaction to or problems with your medicine. °SEEK IMMEDIATE MEDICAL CARE IF: °· You have suicidal feelings.   °· You think you may harm the baby or someone else. °MAKE SURE YOU: °· Understand these instructions. °· Will watch your condition. °· Will get help right away if you are not doing well or get worse. °  °This information is not intended to replace advice given to you by your health care provider. Make sure you discuss any questions you have with your health care provider. °  °Document Released: 09/07/2004 Document Revised: 12/09/2013 Document Reviewed: 09/15/2013 °Elsevier Interactive Patient Education ©2016 [employer] °Postpartum Care After Vaginal Delivery °After you deliver your newborn  (postpartum period), the usual stay in the hospital is 24-72 hours. If there were problems with your labor or delivery, or if you have other medical problems, you might be in the hospital longer.  °While you are in the hospital, you will receive help and instructions on how to care for yourself and your newborn during the postpartum period.  °While you are in the hospital: °· Be sure to tell your nurses if you have pain or discomfort, as well as   where you feel the pain and what makes the pain worse. °· If you had an incision made near your vagina (episiotomy) or if you had some tearing during delivery, the nurses may put ice packs on your episiotomy or tear. The ice packs may help to reduce the pain and swelling. °· If you are breastfeeding, you may feel uncomfortable contractions of your uterus for a couple of weeks. This is normal. The contractions help your uterus get back to normal size. °· It is normal to have some bleeding after delivery. °¨ For the first 1-3 days after delivery, the flow is red and the amount may be similar to a period. °¨ It is common for the flow to start and stop. °¨ In the first few days, you may pass some small clots. Let your nurses know if you begin to pass large clots or your flow increases. °¨ Do not  flush blood clots down the toilet before having the nurse look at them. °¨ During the next 3-10 days after delivery, your flow should become more watery and pink or brown-tinged in color. °¨ Ten to fourteen days after delivery, your flow should be a small amount of yellowish-white discharge. °¨ The amount of your flow will decrease over the first few weeks after delivery. Your flow may stop in 6-8 weeks. Most women have had their flow stop by 12 weeks after delivery. °· You should change your sanitary pads frequently. °· Wash your hands thoroughly with soap and water for at least 20 seconds after changing pads, using the toilet, or before holding or feeding your newborn. °· You should  feel like you need to empty your bladder within the first 6-8 hours after delivery. °· In case you become weak, lightheaded, or faint, call your nurse before you get out of bed for the first time and before you take a shower for the first time. °· Within the first few days after delivery, your breasts may begin to feel tender and full. This is called engorgement. Breast tenderness usually goes away within 48-72 hours after engorgement occurs. You may also notice milk leaking from your breasts. If you are not breastfeeding, do not stimulate your breasts. Breast stimulation can make your breasts produce more milk. °· Spending as much time as possible with your newborn is very important. During this time, you and your newborn can feel close and get to know each other. Having your newborn stay in your room (rooming in) will help to strengthen the bond with your newborn.  It will give you time to get to know your newborn and become comfortable caring for your newborn. °· Your hormones change after delivery. Sometimes the hormone changes can temporarily cause you to feel sad or tearful. These feelings should not last more than a few days. If these feelings last longer than that, you should talk to your caregiver. °· If desired, talk to your caregiver about methods of family planning or contraception. °· Talk to your caregiver about immunizations. Your caregiver may want you to have the following immunizations before leaving the hospital: °¨ Tetanus, diphtheria, and pertussis (Tdap) or tetanus and diphtheria (Td) immunization. It is very important that you and your family (including grandparents) or others caring for your newborn are up-to-date with the Tdap or Td immunizations. The Tdap or Td immunization can help protect your newborn from getting ill. °¨ Rubella immunization. °¨ Varicella (chickenpox) immunization. °¨ Influenza immunization. You should receive this annual immunization if you did not receive the    immunization during your pregnancy. °  °This information is not intended to replace advice given to you by your health care provider. Make sure you discuss any questions you have with your health care provider. °  °Document Released: 10/01/2007 Document Revised: 08/28/2012 Document Reviewed: 07/31/2012 °[employer]vier Interactive Patient Education ©2016 [employer] °Breastfeeding and Mastitis °Mastitis is inflammation of the breast tissue. It can occur in women who are breastfeeding. This can make breastfeeding painful. Mastitis will sometimes go away on its own. Your health care provider will help determine if treatment is needed. °CAUSES °Mastitis is often associated with a blocked milk (lactiferous) duct. This can happen when too much milk builds up in the breast. Causes of excess milk in the breast can include: °· Poor latch-on. If your baby is not latched onto the breast properly, she or he may not empty your breast completely while breastfeeding. °· Allowing too much time to pass between feedings. °· Wearing a bra or other clothing that is too tight. This puts extra pressure on the lactiferous ducts so milk does not flow through them as it should. °Mastitis can also be caused by a bacterial infection. Bacteria may enter the breast tissue through cuts or openings in the skin. In women who are breastfeeding, this may occur because of cracked or irritated skin. Cracks in the skin are often caused when your baby does not latch on properly to the breast. °SIGNS AND SYMPTOMS °· Swelling, redness, tenderness, and pain in an area of the breast. °· Swelling of the glands under the arm on the same side. °· Fever may or may not accompany mastitis. °If an infection is allowed to progress, a collection of pus (abscess) may develop. °DIAGNOSIS  °Your health care provider can usually diagnose mastitis based on your symptoms and a physical exam. Tests may be done to help confirm the diagnosis. These may include: °· Removal of pus  from the breast by applying pressure to the area. This pus can be examined in the lab to determine which bacteria are present. If an abscess has developed, the fluid in the abscess can be removed with a needle. This can also be used to confirm the diagnosis and determine the bacteria present. In most cases, pus will not be present. °· Blood tests to determine if your body is fighting a bacterial infection. °· Mammogram or ultrasound tests to rule out other problems or diseases. °TREATMENT  °Mastitis that occurs with breastfeeding will sometimes go away on its own. Your health care provider may choose to wait 24 hours after first seeing you to decide whether a prescription medicine is needed. If your symptoms are worse after 24 hours, your health care provider will likely prescribe an antibiotic medicine to treat the mastitis. He or she will determine which bacteria are most likely causing the infection and will then select an appropriate antibiotic medicine. This is sometimes changed based on the results of tests performed to identify the bacteria, or if there is no response to the antibiotic medicine selected. Antibiotic medicines are usually given by mouth. You may also be given medicine for pain. °HOME CARE INSTRUCTIONS °· Only take over-the-counter or prescription medicines for pain, fever, or discomfort as directed by your health care provider. °· If your health care provider prescribed an antibiotic medicine, take the medicine as directed. Make sure you finish it even if you start to feel better. °· Do not wear a tight or underwire bra. Wear a soft, supportive bra. °· Increase your fluid   intake, especially if you have a fever. °· Continue to empty the breast. Your health care provider can tell you whether this milk is safe for your infant or needs to be thrown out. You may be told to stop nursing until your health care provider thinks it is safe for your baby. Use a breast pump if you are advised to stop  nursing. °· Keep your nipples clean and dry. °· Empty the first breast completely before going to the other breast. If your baby is not emptying your breasts completely for some reason, use a breast pump to empty your breasts. °· If you go back to work, pump your breasts while at work to stay in time with your nursing schedule. °· Avoid allowing your breasts to become overly filled with milk (engorged). °SEEK MEDICAL CARE IF: °· You have pus-like discharge from the breast. °· Your symptoms do not improve with the treatment prescribed by your health care provider within 2 days. °SEEK IMMEDIATE MEDICAL CARE IF: °· Your pain and swelling are getting worse. °· You have pain that is not controlled with medicine. °· You have a red line extending from the breast toward your armpit. °· You have a fever or persistent symptoms for more than 2-3 days. °· You have a fever and your symptoms suddenly get worse. °MAKE SURE YOU:  °· Understand these instructions. °· Will watch your condition. °· Will get help right away if you are not doing well or get worse. °  °This information is not intended to replace advice given to you by your health care provider. Make sure you discuss any questions you have with your health care provider. °  °Document Released: 03/31/2005 Document Revised: 12/09/2013 Document Reviewed: 07/10/2013 °[employer]vier Interactive Patient Education ©2016 [employer] ° °Breastfeeding °Deciding to breastfeed is one of the best choices you can make for you and your baby. A change in hormones during pregnancy causes your breast tissue to grow and increases the number and size of your milk ducts. These hormones also allow proteins, sugars, and fats from your blood supply to make breast milk in your milk-producing glands. Hormones prevent breast milk from being released before your baby is born as well as prompt milk flow after birth. Once breastfeeding has begun, thoughts of your baby, as well as his or her sucking or  crying, can stimulate the release of milk from your milk-producing glands.  °BENEFITS OF BREASTFEEDING °For Your Baby °· Your first milk (colostrum) helps your baby's digestive system function better. °· There are antibodies in your milk that help your baby fight off infections. °· Your baby has a lower incidence of asthma, allergies, and sudden infant death syndrome. °· The nutrients in breast milk are better for your baby than infant formulas and are designed uniquely for your baby's needs. °· Breast milk improves your baby's brain development. °· Your baby is less likely to develop other conditions, such as childhood obesity, asthma, or type 2 diabetes mellitus. °For You °· Breastfeeding helps to create a very special bond between you and your baby. °· Breastfeeding is convenient. Breast milk is always available at the correct temperature and costs nothing. °· Breastfeeding helps to burn calories and helps you lose the weight gained during pregnancy. °· Breastfeeding makes your uterus contract to its prepregnancy size faster and slows bleeding (lochia) after you give birth.   °· Breastfeeding helps to lower your risk of developing type 2 diabetes mellitus, osteoporosis, and breast or ovarian cancer later in life. °  SIGNS THAT YOUR BABY IS HUNGRY °Early Signs of Hunger °· Increased alertness or activity. °· Stretching. °· Movement of the head from side to side. °· Movement of the head and opening of the mouth when the corner of the mouth or cheek is stroked (rooting). °· Increased sucking sounds, smacking lips, cooing, sighing, or squeaking. °· Hand-to-mouth movements. °· Increased sucking of fingers or hands. °Late Signs of Hunger °· Fussing. °· Intermittent crying. °Extreme Signs of Hunger °Signs of extreme hunger will require calming and consoling before your baby will be able to breastfeed successfully. Do not wait for the following signs of extreme hunger to occur before you initiate  breastfeeding: °· Restlessness. °· A loud, strong cry. °· Screaming. °BREASTFEEDING BASICS °Breastfeeding Initiation °· Find a comfortable place to sit or lie down, with your neck and back well supported. °· Place a pillow or rolled up blanket under your baby to bring him or her to the level of your breast (if you are seated). Nursing pillows are specially designed to help support your arms and your baby while you breastfeed. °· Make sure that your baby's abdomen is facing your abdomen. °· Gently massage your breast. With your fingertips, massage from your chest wall toward your nipple in a circular motion. This encourages milk flow. You may need to continue this action during the feeding if your milk flows slowly. °· Support your breast with 4 fingers underneath and your thumb above your nipple. Make sure your fingers are well away from your nipple and your baby's mouth. °· Stroke your baby's lips gently with your finger or nipple. °· When your baby's mouth is open wide enough, quickly bring your baby to your breast, placing your entire nipple and as much of the colored area around your nipple (areola) as possible into your baby's mouth. °¨ More areola should be visible above your baby's upper lip than below the lower lip. °¨ Your baby's tongue should be between his or her lower gum and your breast. °· Ensure that your baby's mouth is correctly positioned around your nipple (latched). Your baby's lips should create a seal on your breast and be turned out (everted). °· It is common for your baby to suck about 2-3 minutes in order to start the flow of breast milk. °Latching °Teaching your baby how to latch on to your breast properly is very important. An improper latch can cause nipple pain and decreased milk supply for you and poor weight gain in your baby. Also, if your baby is not latched onto your nipple properly, he or she may swallow some air during feeding. This can make your baby fussy. Burping your baby when  you switch breasts during the feeding can help to get rid of the air. However, teaching your baby to latch on properly is still the best way to prevent fussiness from swallowing air while breastfeeding. °Signs that your baby has successfully latched on to your nipple: °· Silent tugging or silent sucking, without causing you pain. °· Swallowing heard between every 3-4 sucks. °· Muscle movement above and in front of his or her ears while sucking. °Signs that your baby has not successfully latched on to nipple: °· Sucking sounds or smacking sounds from your baby while breastfeeding. °· Nipple pain. °If you think your baby has not latched on correctly, slip your finger into the corner of your baby's mouth to break the suction and place it between your baby's gums. Attempt breastfeeding initiation again. °Signs of Successful Breastfeeding °  Signs from your baby: °· A gradual decrease in the number of sucks or complete cessation of sucking. °· Falling asleep. °· Relaxation of his or her body. °· Retention of a small amount of milk in his or her mouth. °· Letting go of your breast by himself or herself. °Signs from you: °· Breasts that have increased in firmness, weight, and size 1-3 hours after feeding. °· Breasts that are softer immediately after breastfeeding. °· Increased milk volume, as well as a change in milk consistency and color by the fifth day of breastfeeding. °· Nipples that are not sore, cracked, or bleeding. °Signs That Your Baby is Getting Enough Milk °· Wetting at least 3 diapers in a 24-hour period. The urine should be clear and pale yellow by age 5 days. °· At least 3 stools in a 24-hour period by age 5 days. The stool should be soft and yellow. °· At least 3 stools in a 24-hour period by age 7 days. The stool should be seedy and yellow. °· No loss of weight greater than 10% of birth weight during the first 3 days of age. °· Average weight gain of 4-7 ounces (113-198 g) per week after age 4  days. °· Consistent daily weight gain by age 5 days, without weight loss after the age of 2 weeks. °After a feeding, your baby may spit up a small amount. This is common. °BREASTFEEDING FREQUENCY AND DURATION °Frequent feeding will help you make more milk and can prevent sore nipples and breast engorgement. Breastfeed when you feel the need to reduce the fullness of your breasts or when your baby shows signs of hunger. This is called "breastfeeding on demand." Avoid introducing a pacifier to your baby while you are working to establish breastfeeding (the first 4-6 weeks after your baby is born). After this time you may choose to use a pacifier. Research has shown that pacifier use during the first year of a baby's life decreases the risk of sudden infant death syndrome (SIDS). °Allow your baby to feed on each breast as long as he or she wants. Breastfeed until your baby is finished feeding. When your baby unlatches or falls asleep while feeding from the first breast, offer the second breast. Because newborns are often sleepy in the first few weeks of life, you may need to awaken your baby to get him or her to feed. °Breastfeeding times will vary from baby to baby. However, the following rules can serve as a guide to help you ensure that your baby is properly fed: °· Newborns (babies 4 weeks of age or younger) may breastfeed every 1-3 hours. °· Newborns should not go longer than 3 hours during the day or 5 hours during the night without breastfeeding. °· You should breastfeed your baby a minimum of 8 times in a 24-hour period until you begin to introduce solid foods to your baby at around 6 months of age. °BREAST MILK PUMPING °Pumping and storing breast milk allows you to ensure that your baby is exclusively fed your breast milk, even at times when you are unable to breastfeed. This is especially important if you are going back to work while you are still breastfeeding or when you are not able to be present during  feedings. Your lactation consultant can give you guidelines on how long it is safe to store breast milk. °A breast pump is a machine that allows you to pump milk from your breast into a sterile bottle. The pumped breast milk can   then be stored in a refrigerator or freezer. Some breast pumps are operated by hand, while others use electricity. Ask your lactation consultant which type will work best for you. Breast pumps can be purchased, but some hospitals and breastfeeding support groups lease breast pumps on a monthly basis. A lactation consultant can teach you how to hand express breast milk, if you prefer not to use a pump. °CARING FOR YOUR BREASTS WHILE YOU BREASTFEED °Nipples can become dry, cracked, and sore while breastfeeding. The following recommendations can help keep your breasts moisturized and healthy: °· Avoid using soap on your nipples. °· Wear a supportive bra. Although not required, special nursing bras and tank tops are designed to allow access to your breasts for breastfeeding without taking off your entire bra or top. Avoid wearing underwire-style bras or extremely tight bras. °· Air dry your nipples for 3-4 minutes after each feeding. °· Use only cotton bra pads to absorb leaked breast milk. Leaking of breast milk between feedings is normal. °· Use lanolin on your nipples after breastfeeding. Lanolin helps to maintain your skin's normal moisture barrier. If you use pure lanolin, you do not need to wash it off before feeding your baby again. Pure lanolin is not toxic to your baby. You may also hand express a few drops of breast milk and gently massage that milk into your nipples and allow the milk to air dry. °In the first few weeks after giving birth, some women experience extremely full breasts (engorgement). Engorgement can make your breasts feel heavy, warm, and tender to the touch. Engorgement peaks within 3-5 days after you give birth. The following recommendations can help ease  engorgement: °· Completely empty your breasts while breastfeeding or pumping. You may want to start by applying warm, moist heat (in the shower or with warm water-soaked hand towels) just before feeding or pumping. This increases circulation and helps the milk flow. If your baby does not completely empty your breasts while breastfeeding, pump any extra milk after he or she is finished. °· Wear a snug bra (nursing or regular) or tank top for 1-2 days to signal your body to slightly decrease milk production. °· Apply ice packs to your breasts, unless this is too uncomfortable for you. °· Make sure that your baby is latched on and positioned properly while breastfeeding. °If engorgement persists after 48 hours of following these recommendations, contact your health care provider or a lactation consultant. °OVERALL HEALTH CARE RECOMMENDATIONS WHILE BREASTFEEDING °· Eat healthy foods. Alternate between meals and snacks, eating 3 of each per day. Because what you eat affects your breast milk, some of the foods may make your baby more irritable than usual. Avoid eating these foods if you are sure that they are negatively affecting your baby. °· Drink milk, fruit juice, and water to satisfy your thirst (about 10 glasses a day). °· Rest often, relax, and continue to take your prenatal vitamins to prevent fatigue, stress, and anemia. °· Continue breast self-awareness checks. °· Avoid chewing and smoking tobacco. Chemicals from cigarettes that pass into breast milk and exposure to secondhand smoke may harm your baby. °· Avoid alcohol and drug use, including marijuana. °Some medicines that may be harmful to your baby can pass through breast milk. It is important to ask your health care provider before taking any medicine, including all over-the-counter and prescription medicine as well as vitamin and herbal supplements. °It is possible to become pregnant while breastfeeding. If birth control is desired, ask your health care    provider about options that will be safe for your baby. °SEEK MEDICAL CARE IF: °· You feel like you want to stop breastfeeding or have become frustrated with breastfeeding. °· You have painful breasts or nipples. °· Your nipples are cracked or bleeding. °· Your breasts are red, tender, or warm. °· You have a swollen area on either breast. °· You have a fever or chills. °· You have nausea or vomiting. °· You have drainage other than breast milk from your nipples. °· Your breasts do not become full before feedings by the fifth day after you give birth. °· You feel sad and depressed. °· Your baby is too sleepy to eat well. °· Your baby is having trouble sleeping.   °· Your baby is wetting less than 3 diapers in a 24-hour period. °· Your baby has less than 3 stools in a 24-hour period. °· Your baby's skin or the white part of his or her eyes becomes yellow.   °· Your baby is not gaining weight by 5 days of age. °SEEK IMMEDIATE MEDICAL CARE IF: °· Your baby is overly tired (lethargic) and does not want to wake up and feed. °· Your baby develops an unexplained fever. °  °This information is not intended to replace advice given to you by your health care provider. Make sure you discuss any questions you have with your health care provider. °  °Document Released: 12/04/2005 Document Revised: 08/25/2015 Document Reviewed: 05/28/2013 °Elsevier Interactive Patient Education ©2016 [employer] ° °

## 2015-10-28 NOTE — Discharge Summary (Signed)
OB Discharge Summary     Patient Name: Rhonda Parrish DOB: 04-Dec-1981 MRN: [id number]  Date of admission: 10/26/2015 Delivering MD: Marlinda Mike   Date of discharge: 10/28/2015  Admitting diagnosis: 40 WEEKS CTX Intrauterine pregnancy: [redacted]w[redacted]d     Secondary diagnosis:  Principal Problem:   Postpartum care following vaginal delivery- vbac (11/8) Active Problems:   Active labor   VBAC (vaginal birth after Cesarean)  Additional problems: none     Discharge diagnosis: VBAC                                                                                                Post partum procedures:none  Augmentation: none  Complications: None  Hospital course:  Onset of Labor With Vaginal Delivery     34 y.o. yo [id number] at [redacted]w[redacted]d was admitted in Active Laboron 10/26/2015. Patient had an uncomplicated labor course as follows:  Membrane Rupture Time/Date: 2:45 AM ,10/26/2015   Intrapartum Procedures: Episiotomy: None [1]                                         Lacerations:  2nd degree [3]  Patient had a delivery of a Viable infant. 10/26/2015  Information for the patient's newborn:  Panameno, Girl Cyrstal [[id number]]       Pateint had an uncomplicated postpartum course.  She is ambulating, tolerating a regular diet, passing flatus, and urinating well. Patient is discharged home in stable condition on 10/28/2015  1:07 PM.    Physical exam  Filed Vitals:   10/26/15 1841 10/27/15 0545 10/27/15 1841 10/28/15 0623  BP: 108/70 95/62 103/59 93/53  Pulse: 79 82 83 89  Temp: 97.9 F (36.6 C) 97.6 F (36.4 C) 98.1 F (36.7 C) 98.4 F (36.9 C)  TempSrc: Oral Oral Oral Oral  Resp: Height:      Weight:      SpO2:    99%   General: alert, cooperative and no distress Lochia: appropriate Uterine Fundus: firm Perineum: 2nd degree repair healing well with no significant drainage, No significant erythema DVT Evaluation: No evidence of DVT seen on physical exam. Negative Homan's  sign. No cords or calf tenderness. No significant calf/ankle edema. Labs: Lab Results  Component Value Date   WBC 13.5* 10/27/2015   HGB 9.6* 10/27/2015   HCT 27.1* 10/27/2015   MCV 93.8 10/27/2015   PLT 175 10/27/2015   No flowsheet data found.  Discharge instruction: per After Visit Summary and "Baby and Me Booklet".  After visit meds:    Medication List    TAKE these medications        hydrocortisone-pramoxine rectal foam  Commonly known as:  PROCTOFOAM-HC  Place 1 applicator rectally 3 (three) times daily.     ibuprofen 600 MG tablet  Commonly known as:  ADVIL,MOTRIN  Take 1 tablet (600 mg total) by mouth every 6 (six) hours.     magnesium 30 MG tablet  Take 200 mg by mouth daily.  prenatal multivitamin Tabs tablet  Take 1 tablet by mouth daily.        Diet: routine diet  Activity: Advance as tolerated. Pelvic rest for 6 weeks.   Outpatient follow up:6 weeks Follow up Appt:No future appointments. Follow up Visit:No Follow-up on file.  Postpartum contraception: Undecided  Newborn Data: Live born female on 10/26/2015 Birth Weight: 6 lb 10 oz (3005 g) APGAR: 8, 9  Baby Feeding: Bottle Disposition:home with mother   10/28/2015 10:03 AM Isley Zinni, Terence LuxOLITTA, M, CNM

## 2015-10-28 NOTE — Progress Notes (Signed)
Patient ID: Rhonda Parrish, female   DOB: August 23, 1981, 34 y.o.   MRN: [id number] Post Partum Day #2            Information for the patient's newborn:  Rhonda Parrish, Girl Rhonda Parrish [[id number]][[id number]]  female   Feeding: breast  Subjective: No HA, SOB, CP, F/C, breast symptoms. Pain well-controlled with ibuprofen. Normal vaginal bleeding, no clots.      Objective:  Temp:  [98.1 F (36.7 C)-98.4 F (36.9 C)] 98.4 F (36.9 C) (11/10 0623) Pulse Rate:  [83-89] 89 (11/10 0623) Resp:  [18] 18 (11/10 0623) BP: (93-103)/(53-59) 93/53 mmHg (11/10 0623) SpO2:  [99 %] 99 % (11/10 0623)  No intake or output data in the 24 hours ending 10/28/15 1000     Recent Labs  10/27/15 0545  WBC 13.5*  HGB 9.6*  HCT 27.1*  PLT 175    Blood type:  A Positive Rubella:   Immune   Physical Exam:  General: alert, cooperative and no distress Uterine Fundus: firm Lochia: appropriate Perineum: repair healing well, edema none DVT Evaluation: No evidence of DVT seen on physical exam. Negative Homan's sign. No cords or calf tenderness. No significant calf/ankle edema.    Assessment/Plan: PPD # 2 / 34 y.o., [id number] S/P: VBAC   Principal Problem:   Postpartum care following vaginal delivery- vbac (11/8) Active Problems:   Active labor   VBAC (vaginal birth after Cesarean)   normal postpartum exam  Continue current postpartum care  D/C home today   LOS: 2 days   Arita MissAWSON, Jaxxon Naeem, M, MSN, CNM 10/28/2015, 10:00 AM

## 2018-10-22 ENCOUNTER — Ambulatory Visit: Payer: Self-pay | Admitting: Psychiatry

## 2018-10-22 DIAGNOSIS — F422 Mixed obsessional thoughts and acts: Secondary | ICD-10-CM

## 2018-10-22 NOTE — Progress Notes (Signed)
      [facility] Counselor/Therapist Progress Note   Patient ID: Rhonda Parrish, MRN: [id number]  Date: 10/22/2018  Timespent: 51 minutes   Treatment Type: Individual   Reported Symptoms: Obsessive thinking   Mental Status Exam:    Appearance:   Casual     Behavior:  Appropriate  Motor:  Normal  Speech/Language:   Clear and Coherent  Affect:  Tearful  Mood:  anxious  Thought process:  normal  Thought content:    WNL  Sensory/Perceptual disturbances:    WNL  Orientation:  oriented to person, place, time/date and situation  Attention:  Good  Concentration:  Good  Memory:  WNL  Fund of knowledge:   Good  Insight:    Good  Judgment:   Good  Impulse Control:  Good     Risk Assessment: Danger to Self:  No Self-injurious Behavior: No Danger to Others: No Duty to Warn:no Physical Aggression / Violence:No  Access to Firearms a concern: No  Gang Involvement:No    Subjective: The client comes in today prepared to do eye movement desensitization and reprocessing around her anxiety with the conflict with her brother-in-law and sister-in-law.  She is upset that they will invite her children to a birthday party at their house and pointedly not invite them, the parents.  Due to the client's obsessive-compulsive disorder she overanalyzes, catastrophizes and then becomes paralyzed by fear. We focused on the conflict with her in law's.  Her negative thought was, "I am jealous and angry with them."  She feels it in her chest, her subjective units of distress is at a 7.  As the client processed she was able to see that she had to take better control of her catastrophic thought process.  She needed to quit texting with her sister-in-law who would draw her into the conflict between her husband and his brother.  This would overwhelm the client.  We tried to move towards acceptance.  The client visualized Jesus at their local park where her anxiety tends to go up.  She realized that there  was a barrier between her and God she thinks "probably because I do not trust."  The client was able to get her anxiety down to below of 4 at the end of session. The client is not interested in an antidepressant to treat her OCD.  She is planning on trying a supplement called myoinositol.  She will take 18 g of that per day most likely in a smoothie.  She will report back how it works for her.   Interventions: Eye Movement Desensitization and Reprocessing (EMDR) and Insight-Oriented   Diagnosis:   ICD-10-CM   1. Mixed obsessional thoughts and acts F42.2      Plan: Supplement, myo-inositol, positive self talk.   Gelene Mink Rondal [place], [place]

## 2018-11-04 ENCOUNTER — Encounter

## 2018-11-04 ENCOUNTER — Encounter: Payer: Self-pay | Admitting: Psychiatry

## 2018-11-04 ENCOUNTER — Ambulatory Visit (INDEPENDENT_AMBULATORY_CARE_PROVIDER_SITE_OTHER): Payer: Self-pay | Admitting: Psychiatry

## 2018-11-04 DIAGNOSIS — F422 Mixed obsessional thoughts and acts: Secondary | ICD-10-CM

## 2018-11-04 NOTE — Progress Notes (Signed)
      [facility] Counselor/Therapist Progress Note   Patient ID: Asher MuirMelissa Redder, MRN: [id number]  Date: 11/04/2018  Timespent: 50 minutes   Treatment Type: Individual   Reported Symptoms: Obsessive thinking   Mental Status Exam:    Appearance:   Well Groomed     Behavior:  Appropriate  Motor:  Normal  Speech/Language:   Clear and Coherent  Affect:  Appropriate  Mood:  anxious  Thought process:  normal  Thought content:    WNL  Sensory/Perceptual disturbances:    WNL  Orientation:  oriented to person, place, time/date and situation  Attention:  Good  Concentration:  Good  Memory:  WNL  Fund of knowledge:   Good  Insight:    Good  Judgment:   Good  Impulse Control:  Good     Risk Assessment: Danger to Self:  No Self-injurious Behavior: No Danger to Others: No Duty to Warn:no Physical Aggression / Violence:No  Access to Firearms a concern: No  Gang Involvement:No    Subjective: The client states that her anxiety is at a subjective units of distress of 9 today.  Her biggest fears are around germs and contamination.  She states she has so many different things that race through her mind and exhaust her.  She describes a number of different intrusive thoughts about fear of contamination, her in-laws, if she is making the right decision or not.  Today I used eye movement desensitization and reprocessing to focus solely on the client's anxiety.  She was able to reduce her anxiety from the suds equals 9 to less than 5 at the end of the session.  She is worried that she is "crazy".  I explained to her that obsessive-compulsive disorder does not mean that she is crazy but that intrusive thoughts caused tremendous anxiety.  We discussed how to address the intrusive thoughts by first saying that they were intrusive.  Secondly I asked the client to ask herself the question is this something of rational person would do?  If not and it was connected to a lot of anxiety it Lenardo Westwood very  well be an intrusive thought.  The client has read brain lock and tries to do some of the exercises laid out in the book.  I explained to the client that we will continue to develop behavioral interventions that she can use to manage her OCD.  The client did not use the myoinositol very much but agrees to give it a full 2 weeks on a daily basis.  The client is also experimenting with 5 HTP and l-theanine.   Interventions: Cognitive Behavioral Therapy, Solution-Oriented/Positive Psychology, Eye Movement Desensitization and Reprocessing (EMDR) and Insight-Oriented   Diagnosis:   ICD-10-CM   1. Mixed obsessional thoughts and acts F42.2      Plan: Thought stopping, positive self talk.   Gelene MinkFrederick Nitya [place]authon, [place]

## 2018-11-13 ENCOUNTER — Encounter: Payer: Self-pay | Admitting: Psychiatry

## 2018-11-13 ENCOUNTER — Ambulatory Visit: Payer: Self-pay | Admitting: Psychiatry

## 2018-11-13 DIAGNOSIS — F422 Mixed obsessional thoughts and acts: Secondary | ICD-10-CM

## 2018-11-13 NOTE — Progress Notes (Signed)
      [facility] Counselor/Therapist Progress Note  Patient ID: Asher MuirMelissa [place]field, MRN: [id number],    Date: 11/13/2018   Time Spent: 51 minutes   Treatment Type: Individual Therapy  Reported Symptoms: Anxious Mood and Sleep disturbance  Mental Status Exam:  Appearance:   Casual     Behavior:  Appropriate  Motor:  Normal  Speech/Language:   Clear and Coherent  Affect:  Tearful  Mood:  anxious and sad  Thought process:  normal  Thought content:    WNL  Sensory/Perceptual disturbances:    WNL  Orientation:  oriented to person, place, time/date and situation  Attention:  Good  Concentration:  Good  Memory:  WNL  Fund of knowledge:   Good  Insight:    Good  Judgment:   Good  Impulse Control:  Good   Risk Assessment: Danger to Self:  No Self-injurious Behavior: No Danger to Others: No Duty to Warn:no Physical Aggression / Violence:No  Access to Firearms a concern: No  Gang Involvement:No   Subjective: The client comes in today with a high level of agitation.  She describes ongoing problems with obsessional thoughts.  This chronic worry shifts from one thing to another.  "If my neighbor visits to have to clean where she sits?  Is the parking lot at [employer] clean?  Are my children safe playing over at a neighbors house?  Do I need to change my clothes and shower even after today's visit?"  The client has been trying supplements such as myoinositol, 5 HTP and l-theanine without much success.  She tries to exercise regularly.  Lately her sleep has become much worse.  She comes in very tired today.  We had a long discussion about whether or not she should pursue psychiatric medications for her OCD.  She is afraid to do so and states, "I do not want to be like my mom."  She asked the question if she should just persevere trying to power through all of the repetitive thinking that is going through her head.  I reflected back to the client that she seems miserable and overwhelmed.  I told  her not to give going on medication such meaning and power.  It is only a tool to use to help her get better.  I suggested that the client get a second opinion with Dr. Meredith Staggersarey Cottle.  The client had seen Corie ChiquitoJessica Carter, NP in the past.  She did not have any problems with Shanda BumpsJessica but the fact that Dr. Jennelle Humanottle does have a lot of wisdom about OCD was encouraging to her.  I told her I would speak with Dr. Jennelle Humanottle about a second opinion.  Interventions: Cognitive Behavioral Therapy, Solution-Oriented/Positive Psychology, Eye Movement Desensitization and Reprocessing (EMDR) and Insight-Oriented  Diagnosis:   ICD-10-CM   1. Mixed obsessional thoughts and acts F42.2     Plan: 2nd opinion, positive self talk, distraction.  Gelene MinkFrederick Rocko [place], [place]

## 2018-11-25 ENCOUNTER — Ambulatory Visit: Payer: Self-pay | Admitting: Psychiatry

## 2018-11-25 ENCOUNTER — Encounter: Payer: Self-pay | Admitting: Psychiatry

## 2018-11-25 DIAGNOSIS — F422 Mixed obsessional thoughts and acts: Secondary | ICD-10-CM

## 2018-11-25 NOTE — Progress Notes (Signed)
      [facility] Counselor/Therapist Progress Note  Patient ID: Rhonda Parrish, MRN: [id number],    Date: 11/25/2018  Time Spent: 50 minutes   Treatment Type: Individual Therapy  Reported Symptoms: Anxious Mood and Ruminations  Mental Status Exam:  Appearance:   Casual     Behavior:  Appropriate  Motor:  Normal  Speech/Language:   Clear and Coherent  Affect:  Appropriate  Mood:  anxious and sad  Thought process:  normal  Thought content:    WNL  Sensory/Perceptual disturbances:    WNL  Orientation:  oriented to person, place, time/date and situation  Attention:  Good  Concentration:  Good  Memory:  WNL  Fund of knowledge:   Good  Insight:    Good  Judgment:   Good  Impulse Control:  Good   Risk Assessment: Danger to Self:  No Self-injurious Behavior: No Danger to Others: No Duty to Warn:no Physical Aggression / Violence:No  Access to Firearms a concern: No  Gang Involvement:No   Subjective: I discussed with the client the fact that she would be having an a second opinion with Dr.Cottle.  We discussed at length how this would go.  He would explained to her how the medications work.  He would also explain side effects.  The client felt comfortable with this but needed reassurance that this is what she needed to do.  Today the client was discussing her obsession over the house they were buying.  She has found herself googling the previous owners constructing some terrible event that has caused them to move.  This would somehow impact her family when they moved into the house.  She said "this is crazy isn't it?"  I agreed that this was not normal behavior to have that level of magical thinking.  The client agreed to quit googling the owners and trust the process with her husband.  Interventions: Solution-Oriented/Positive Psychology and Insight-Oriented  Diagnosis:   ICD-10-CM   1. Mixed obsessional thoughts and acts F42.2     Plan: Acceptance, mood independent  behavior, exercise.  Rhonda Parrish, [place]

## 2018-12-16 DIAGNOSIS — F422 Mixed obsessional thoughts and acts: Secondary | ICD-10-CM

## 2018-12-19 ENCOUNTER — Encounter

## 2018-12-24 ENCOUNTER — Encounter: Payer: Self-pay | Admitting: Psychiatry

## 2018-12-24 ENCOUNTER — Ambulatory Visit: Payer: Self-pay | Admitting: Psychiatry

## 2018-12-24 DIAGNOSIS — F422 Mixed obsessional thoughts and acts: Secondary | ICD-10-CM

## 2018-12-24 NOTE — Progress Notes (Signed)
      [facility] Counselor/Therapist Progress Note  Patient ID: Rhonda Parrish, MRN: [id number],    Date: 12/24/2018  Time Spent: 48 minutes   Treatment Type: Individual Therapy  Reported Symptoms: Depressed mood, Anxious Mood, Ruminations, Compulsive behaviors, Sleep disturbance and Fatigue  Mental Status Exam:  Appearance:   Casual and Well Groomed     Behavior:  Appropriate  Motor:  Normal  Speech/Language:   Clear and Coherent  Affect:  Appropriate  Mood:  anxious, depressed and sad  Thought process:  normal  Thought content:    WNL  Sensory/Perceptual disturbances:    WNL  Orientation:  oriented to person, place, time/date and situation  Attention:  Good  Concentration:  Good  Memory:  WNL  Fund of knowledge:   Good  Insight:    Good  Judgment:   Good  Impulse Control:  Good   Risk Assessment: Danger to Self:  No Self-injurious Behavior: No Danger to Others: No Duty to Warn:no Physical Aggression / Violence:No  Access to Firearms a concern: No  Gang Involvement:No   Subjective: The client reports that since she will see Dr. Jennelle Human on January 28.  She states today, "I am constantly fearful of something."  We discussed at length today how to stop the chronic obsessive and intrusive thoughts that the client has.  We discussed the book brain lock which she understands but found less than helpful.  The client does see that changing her environment is helpful.  For example if she is at the house and then she goes to the park with her children it helps take her off what she is obsessing over.  The client also finds that distraction is helpful.  We discussed if the client could write out some of her thoughts and then begin to address them in terms of are these true or not true.  Also to evaluate what is OCD and what is herself.  The client feels that it is helpful but, "I am just exhausted with all this."  Interventions: Cognitive Behavioral Therapy, Solution-Oriented/Positive  Psychology, Psycho-education/Bibliotherapy and Insight-Oriented  Diagnosis:   ICD-10-CM   1. Mixed obsessional thoughts and acts F42.2     Plan: Distraction, changing environment, journal, exercise.  Rhonda Parrish, [place]

## 2019-01-07 ENCOUNTER — Ambulatory Visit: Payer: Self-pay | Admitting: Psychiatry

## 2019-01-14 ENCOUNTER — Encounter: Payer: Self-pay | Admitting: Psychiatry

## 2019-01-14 ENCOUNTER — Ambulatory Visit: Payer: Self-pay | Admitting: Psychiatry

## 2019-01-14 ENCOUNTER — Encounter

## 2019-01-14 DIAGNOSIS — F411 Generalized anxiety disorder: Secondary | ICD-10-CM

## 2019-01-14 DIAGNOSIS — F422 Mixed obsessional thoughts and acts: Secondary | ICD-10-CM

## 2019-01-14 MED ORDER — SERTRALINE HCL 100 MG PO TABS: ORAL_TABLET | ORAL | 1 refills | Status: DC

## 2019-01-14 NOTE — Progress Notes (Signed)
[facility] MD/PA/NP Initial Note  01/14/2019 11:03 AM Rhonda Parrish  MRN:  [id number]  Chief Complaint:  Chief Complaint    Follow-up; Anxiety      HPI: Referred from therapist for  Treatment of anxiety and OCD.  Has anxiety including about medication.  Some avoidance related to having kids and nursing.  More open bc not nursing and want better anxiety control.  Just turned 38 and didn't have another child bc anxiety unmanageable.  Intrusive thoughts, contamination issues.  Fear of STD going in public.  Never used to have the thoughts where she worries about going out and coming and contaminating the house.  Always on edge.  Not necessarily germs and sickness but fear of sperm being in public places.  Not sure when it started, but probably about 5 years ago.  A lot of fears otherwise.  Anxiety causes SOB and chest pain.  Goes to bed late bc can't fall asleep.  Usually 6-7 hours of sleep.  Some compulsive door checking and compulsions about laundry.  Has to be clean to do laundry.  Has to shower before.  Usually 1 shower daily.  Some avoidance of going places DT fear of contamination.  Afraid of parks.  Does not force kids to bathe excessively.  Probably handwash excessively and H would agree.  Some compulsive reassurance checking with H.  Resources good husband and kids but fears bad things happening had this always.  Kids 5 and 3yo.  Hard to leave the kids.  So scared constantly but it may vary from time to time.  Can obsess on family conflict.    Last seen 2012 and had some social anxiety and scrupulosity  Not much compulsive prayer now..  Patient reports stable mood and denies depressed or irritable moods.  Patient reports substantial chronic difficulty with anxiety and obsessive thoughts..  Patient problems with sleep maintenance. Denies appetite disturbance.  Patient reports that energy and motivation have been good.  Patient denies any difficulty with concentration except when  obsessing..  Patient denies any suicidal ideation.    Visit Diagnosis:    ICD-10-CM   1. Mixed obsessional thoughts and acts F42.2   2. Generalized anxiety disorder F41.1     Past Psychiatric History:  Seen in 2012 and dx GAD and OCD but she refused meds.  Multiple counselors.  Past Medical History:  Past Medical History:  Diagnosis Date  . Medical history non-contributory   . No pertinent past medical history     Past Surgical History:  Procedure Laterality Date  . CESAREAN SECTION N/A 02/01/2013   Procedure: CESAREAN SECTION;  Surgeon: Robley Fries, MD;  Location: [facility];  Service: Obstetrics;  Laterality: N/A;    Family Psychiatric History: M and B anxiety and OCD  Family History:  Family History  Problem Relation Age of Onset  . Cancer Father   . Stroke Maternal Grandmother   . OCD Mother        scrupulosity  . OCD Brother        contamination.    Social History:  Social History   Socioeconomic History  . Marital status: Married    Spouse name: Not on file  . Number of children: Not on file  . Years of education: Not on file  . Highest education level: Not on file  Occupational History  . Not on file  Social Needs  . Financial resource strain: Not on file  . Food insecurity:    Worry: Not on file  Inability: Not on file  . Transportation needs:    Medical: Not on file    Non-medical: Not on file  Tobacco Use  . Smoking status: Never Smoker  . Smokeless tobacco: Never Used  Substance and Sexual Activity  . Alcohol use: No  . Drug use: No  . Sexual activity: Yes    Partners: Male    Birth control/protection: None    Comment: currently pregnant  Lifestyle  . Physical activity:    Days per week: Not on file    Minutes per session: Not on file  . Stress: Not on file  Relationships  . Social connections:    Talks on phone: Not on file    Gets together: Not on file    Attends religious service: Not on file    Active member of club or  organization: Not on file    Attends meetings of clubs or organizations: Not on file    Relationship status: Not on file  Other Topics Concern  . Not on file  Social History Narrative  . Not on file    Allergies: No Known Allergies  Metabolic Disorder Labs: No results found for: HGBA1C, MPG No results found for: PROLACTIN No results found for: CHOL, TRIG, HDL, CHOLHDL, VLDL, LDLCALC No results found for: TSH  Therapeutic Level Labs: No results found for: LITHIUM No results found for: VALPROATE No components found for:  CBMZ  Current Medications: Current Outpatient Medications  Medication Sig Dispense Refill  . magnesium 30 MG tablet Take 200 mg by mouth daily.    . Prenatal Vit-Fe Fumarate-FA (PRENATAL MULTIVITAMIN) TABS Take 1 tablet by mouth daily.     No current facility-administered medications for this visit.     Medication Side Effects: none  Orders placed this visit:  No orders of the defined types were placed in this encounter.   Psychiatric Specialty Exam:  Review of Systems  Constitutional: Negative.   HENT: Negative.   Eyes: Negative.   Respiratory: Positive for shortness of breath.   Cardiovascular: Positive for chest pain.  Gastrointestinal: Negative.   Genitourinary: Negative.   Musculoskeletal: Negative.   Skin: Negative.   Neurological: Negative.   Endo/Heme/Allergies: Negative.   Psychiatric/Behavioral: Negative for depression, hallucinations, memory loss, substance abuse and suicidal ideas. The patient is nervous/anxious and has insomnia.     unknown if currently breastfeeding.There is no height or weight on file to calculate BMI.  General Appearance: Casual and Neat  Eye Contact:  Good  Speech:  Clear and Coherent and Normal Rate  Volume:  Normal  Mood:  Anxious  Affect:  Tearful and Anxious  Thought Process:  Goal Directed  Orientation:  Full (Time, Place, and Person)  Thought Content: Obsessions   Suicidal Thoughts:  No  Homicidal  Thoughts:  No  Memory:  WNL  Judgement:  Good  Insight:  Good  Psychomotor Activity:  Normal  Concentration:  Concentration: Good  Recall:  Good  Fund of Knowledge: Good  Language: Good  Assets:  Communication Skills Desire for Improvement Financial Resources/Insurance Housing Intimacy Leisure Time Physical Health Social Support Talents/Skills Transportation  ADL's:  Intact  Cognition: WNL  Prognosis:  Good   Screenings:   Receiving Psychotherapy: Yes   Treatment Plan/Recommendations: Greater than 50% of face to face time with patient was spent on counseling and coordination of care. We discussed Explained in detail GAD and OCD dx and how SSRIs work.  Explained  In detail the SE. she has a longstanding fear  of taking psychiatric medications.  We discussed those concerns and answered those questions.  We discussed the different types of SSRIs and the different side effect profiles that exist between them.  Recommend sertraline.  She wants to be aggressive so 50 for a week then 100.  Disc treatment plan and length of treatment.  CBT techniques disc in detail including specific examples of how to apply exposure response prevention techniques. Rec reading Fabian Sharp, Getting Control  If there is no improvement in 4 weeks and no side effects call and we'll increase to 150 mg daily.  60 mins.  FU 6 weeks  Meredith Staggers, MD, DFAPA    Lauraine Rinne, MD

## 2019-01-14 NOTE — Patient Instructions (Addendum)
Rhonda Parrish, Getting Control book.  If there is no improvement in 4 weeks and no side effects call and we'll increase to 150 mg daily.

## 2019-01-21 ENCOUNTER — Ambulatory Visit: Payer: Self-pay | Admitting: Psychiatry

## 2019-01-21 ENCOUNTER — Encounter

## 2019-01-21 ENCOUNTER — Encounter: Payer: Self-pay | Admitting: Psychiatry

## 2019-01-21 DIAGNOSIS — F422 Mixed obsessional thoughts and acts: Secondary | ICD-10-CM

## 2019-01-21 NOTE — Progress Notes (Signed)
      [facility] Counselor/Therapist Progress Note  Patient ID: Rhonda Parrish, MRN: [id number],    Date: 01/21/2019  Time Spent: 48 minutes   Treatment Type: Individual Therapy  Reported Symptoms: Anxious Mood and Ruminations  Mental Status Exam:  Appearance:   Casual     Behavior:  Appropriate  Motor:  Normal  Speech/Language:   Clear and Coherent  Affect:  Appropriate  Mood:  anxious  Thought process:  normal  Thought content:    WNL  Sensory/Perceptual disturbances:    WNL  Orientation:  oriented to person, place, time/date and situation  Attention:  Good  Concentration:  Good  Memory:  WNL  Fund of knowledge:   Good  Insight:    Good  Judgment:   Good  Impulse Control:  Good   Risk Assessment: Danger to Self:  No Self-injurious Behavior: No Danger to Others: No Duty to Warn:no Physical Aggression / Violence:No  Access to Firearms a concern: No  Gang Involvement:No   Subjective: The client states that she met with Dr. Clovis Pu last week.  She felt the appointment with him went very well.  She has started on 50 mg of sertraline.  Tomorrow she will increase it to 100 mg.  "I have noticed some positive change at least 5 to 10% change.  I used the EMDR hand paddles today with the client as she talked about some issues that she was obsessing over.  Her sister-in-law and brother-in-law have decided to send their children to the same school that she and her husband are sending their children.  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  She has found her sister-in-law to be confusing in her interactions with her.  At times she will pointedly not invite them over to things but invite her children and then tell them that they want to reconcile.  The client feels trapped in conversations with her.  We discussed ways of setting assertive boundaries with her sister-in-law.  The main issue is really between the  2 brothers so I encouraged her to defer to him.  Then she could tell her sister-in-law she is uncomfortable discussing this if she has issues she can talk to the clients husband.  She agreed.  Her anxiety reduced some I encouraged the client to trust her judgment.  Also at our next session she will have been on the 100 mg of sertraline so we are expecting to see an improvement.  Interventions: Assertiveness/Communication, Solution-Oriented/Positive Psychology, Eye Movement Desensitization and Reprocessing (EMDR) and Insight-Oriented  Diagnosis:   ICD-10-CM   1. Mixed obsessional thoughts and acts F42.2     Plan: Boundaries, assertiveness, self-care, exercise.  Rhonda Parrish, [place]

## 2019-01-22 ENCOUNTER — Other Ambulatory Visit: Payer: Self-pay | Admitting: Family Medicine

## 2019-01-22 DIAGNOSIS — Z1231 Encounter for screening mammogram for malignant neoplasm of breast: Secondary | ICD-10-CM

## 2019-02-04 ENCOUNTER — Ambulatory Visit: Payer: Self-pay | Admitting: Psychiatry

## 2019-02-04 ENCOUNTER — Encounter: Payer: Self-pay | Admitting: Psychiatry

## 2019-02-04 DIAGNOSIS — F422 Mixed obsessional thoughts and acts: Secondary | ICD-10-CM

## 2019-02-04 NOTE — Progress Notes (Signed)
      [facility] Counselor/Therapist Progress Note  Patient ID: Rhonda Parrish, MRN: [id number],    Date: 02/04/2019  Time Spent: 51 minutes   Treatment Type: Individual Therapy  Reported Symptoms: Anxiety, obsessional thoughts, catastrophic thinking, disrupted sleep.  Mental Status Exam:  Appearance:   Casual and Well Groomed     Behavior:  Appropriate  Motor:  Normal  Speech/Language:   Clear and Coherent  Affect:  Appropriate  Mood:  anxious  Thought process:  normal  Thought content:    WNL  Sensory/Perceptual disturbances:    WNL  Orientation:  oriented to person, place, time/date and situation  Attention:  Good  Concentration:  Good  Memory:  WNL  Fund of knowledge:   Good  Insight:    Good  Judgment:   Good  Impulse Control:  Good   Risk Assessment: Danger to Self:  No Self-injurious Behavior: No Danger to Others: No Duty to Warn:no Physical Aggression / Violence:No  Access to Firearms a concern: No  Gang Involvement:No   Subjective: The client states that she is seeing more of a positive impact from the increase to the 100 mg of the sertraline.  She reports no specific side effects.  She does note that intrusive thoughts continue to come but they do not bark quite as loud.  She states today, "I had like to move from the stressor of the day to how to handle it instead."  She reports that her son's birthday will be this Sunday.  She is trying to decide if she should invite all her sons school friends and the cousins or just make it a family thing.  Her worry is that her sister-in-law will attend with all the school friends and speak ill of her with the other parents.  As we dissected this I explained to her that most of the parents have very busy lines and are not really interested in entering into their family conflict.  If her sister-in-law does say something they will be client but it does not necessarily mean that they will take her side.  She will have to turn  things over to the care of God to the best of her ability and not let what she fears her sister-in-law will do control her behavior.  This goes back to the concept of mood independent behavior.  The client will focus on this.  We did some role-play about interactions that she fears might happen between her and other parents.  This gave her more confidence.  Interventions: Roleplay, Solution-Oriented/Positive Psychology, Eye Movement Desensitization and Reprocessing (EMDR) and Insight-Oriented  Diagnosis:   ICD-10-CM   1. Mixed obsessional thoughts and acts F42.2     Plan: Positive self talk, mood independent behavior, boundaries, assertiveness.  Rhonda Parrish, [place]

## 2019-02-18 ENCOUNTER — Encounter: Payer: Self-pay | Admitting: Psychiatry

## 2019-02-18 ENCOUNTER — Ambulatory Visit: Payer: Self-pay | Admitting: Psychiatry

## 2019-02-18 DIAGNOSIS — F411 Generalized anxiety disorder: Secondary | ICD-10-CM

## 2019-02-18 DIAGNOSIS — F422 Mixed obsessional thoughts and acts: Secondary | ICD-10-CM

## 2019-02-18 NOTE — Progress Notes (Signed)
      [facility] Counselor/Therapist Progress Note  Patient ID: Ajooni Sanda, MRN: [id number],    Date: 02/18/2019  Time Spent: 52 minutes   Treatment Type: Individual Therapy  Reported Symptoms: anxiety, obsessional thoughts, irritability  Mental Status Exam:  Appearance:   Casual and Well Groomed     Behavior:  Appropriate  Motor:  Normal  Speech/Language:   Clear and Coherent  Affect:  Appropriate  Mood:  anxious and irritable  Thought process:  normal  Thought content:    Obsessions and Rumination  Sensory/Perceptual disturbances:    WNL  Orientation:  oriented to person, place, time/date and situation  Attention:  Good  Concentration:  Good  Memory:  WNL  Fund of knowledge:   Good  Insight:    Good  Judgment:   Good  Impulse Control:  Good   Risk Assessment: Danger to Self:  No Self-injurious Behavior: No Danger to Others: No Duty to Warn:no Physical Aggression / Violence:No  Access to Firearms a concern: No  Gang Involvement:No   Subjective: The client states that she continues to see improvement with her anxiety and OCD.  She is also noted that she is less irritable.  Today she started the 150 mg of Zoloft.  The client is worried about her son attending a [employer] event in [place] with her mother and father.  Today we used EMDR focusing on this concern.  Her negative cognition was something bad will happen.  She felt anxiety in her chest.  Her subjective feelings of distress was none.  As the client processed illogically went through producing rational responses to her irrational fears.  She was able to get her subjective units of distress to 5+.  She does feel that she would be able to let them go.  Interventions: Assertiveness/Communication, Solution-Oriented/Positive Psychology, Eye Movement Desensitization and Reprocessing (EMDR) and Insight-Oriented  Diagnosis:   ICD-10-CM   1. Mixed obsessional thoughts and acts F42.2   2. Generalized anxiety disorder F41.1      Plan: Positive self talk, skills from the book brain lock.  Continue with the increase in Zoloft.  This record has been created using [employer].  Chart creation errors have been sought, but Evlyn Amason not always have been located and corrected. Such creation errors do not reflect on the standard of medical care.   Joah Patlan, [place]

## 2019-02-28 ENCOUNTER — Ambulatory Visit: Payer: Self-pay | Admitting: Psychiatry

## 2019-03-04 ENCOUNTER — Ambulatory Visit: Payer: Self-pay | Admitting: Psychiatry

## 2019-03-07 ENCOUNTER — Telehealth: Payer: Self-pay | Admitting: Psychiatry

## 2019-03-07 NOTE — Telephone Encounter (Signed)
Patient stated she talked to Dr. Jennelle Human about increasing the Sertraline wants to know if she can go up to 200 on the next refill.  Need refill to be sent to [facility] [address], patient has appt on 03/31

## 2019-03-10 ENCOUNTER — Other Ambulatory Visit: Payer: Self-pay | Admitting: Psychiatry

## 2019-03-10 ENCOUNTER — Telehealth: Payer: Self-pay | Admitting: Psychiatry

## 2019-03-10 DIAGNOSIS — F422 Mixed obsessional thoughts and acts: Secondary | ICD-10-CM

## 2019-03-10 MED ORDER — SERTRALINE HCL 100 MG PO TABS: ORAL_TABLET | ORAL | 1 refills | Status: DC

## 2019-03-10 MED ORDER — SERTRALINE HCL 100 MG PO TABS: 150.0000 mg | ORAL_TABLET | Freq: Every day | ORAL | 0 refills | Status: DC

## 2019-03-10 NOTE — Telephone Encounter (Signed)
Needing Sertraline called in b/c will be out tonight, 03/10/19

## 2019-03-10 NOTE — Progress Notes (Signed)
Sertraline increased to 150 daily per patient request.  She requested 200 but she was previously on 100 that seems excessive.

## 2019-03-18 ENCOUNTER — Encounter: Payer: Self-pay | Admitting: Psychiatry

## 2019-03-18 ENCOUNTER — Other Ambulatory Visit: Payer: Self-pay

## 2019-03-18 ENCOUNTER — Ambulatory Visit (INDEPENDENT_AMBULATORY_CARE_PROVIDER_SITE_OTHER): Payer: Self-pay | Admitting: Psychiatry

## 2019-03-18 ENCOUNTER — Ambulatory Visit: Payer: Self-pay | Admitting: Psychiatry

## 2019-03-18 DIAGNOSIS — F422 Mixed obsessional thoughts and acts: Secondary | ICD-10-CM

## 2019-03-18 DIAGNOSIS — F411 Generalized anxiety disorder: Secondary | ICD-10-CM

## 2019-03-18 MED ORDER — SERTRALINE HCL 100 MG PO TABS: 200.0000 mg | ORAL_TABLET | Freq: Every day | ORAL | 0 refills | Status: DC

## 2019-03-18 NOTE — Progress Notes (Signed)
Melissaann Axline [id number] 01/22/1981 38 y.o.  Subjective:   Patient ID:  Rhonda Parrish is a 38 y.o. (DOB [birth year]) female.  Chief Complaint:  Chief Complaint  Patient presents with  . Anxiety  . OCD    HPI Rhonda Parrish presents to the office today for follow-up of OCD and generalized anxiety disorder.  She was seen in 2012 but did not follow-up.  She presented again for evaluation and treatment January 14, 2019 which was her last visit.  At that visit we started sertraline with the plan to increase to 100 mg daily she was also given information about cognitive behavior therapy and specifically exposure response prevention techniques and reading material was recommended.  She increased sertraline to 150 mg daily about 2 weeks ago.  Less on edge and less depressed and irritable.  For the first few weeks still had CP with anxiety and then it's improved overall except for worry over Covid.  Still hard to do certain things and some avoidance.   Review of Systems:  Review of Systems  Cardiovascular: Positive for chest pain.  Neurological: Negative for tremors and weakness.    Medications: I have reviewed the patient's current medications.  Current Outpatient Medications  Medication Sig Dispense Refill  . magnesium 30 MG tablet Take 200 mg by mouth daily.    . Prenatal Vit-Fe Fumarate-FA (PRENATAL MULTIVITAMIN) TABS Take 1 tablet by mouth daily.    . sertraline (ZOLOFT) 100 MG tablet Take 1.5 tablets (150 mg total) by mouth daily. 45 tablet 0   No current facility-administered medications for this visit.     Medication Side Effects: None  Allergies: No Known Allergies  Past Medical History:  Diagnosis Date  . Medical history non-contributory   . No pertinent past medical history     Family History  Problem Relation Age of Onset  . Cancer Father   . Stroke Maternal Grandmother   . OCD Mother        scrupulosity  . OCD Brother        contamination.    Social  History   Socioeconomic History  . Marital status: Married    Spouse name: Not on file  . Number of children: Not on file  . Years of education: Not on file  . Highest education level: Not on file  Occupational History  . Not on file  Social Needs  . Financial resource strain: Not on file  . Food insecurity:    Worry: Not on file    Inability: Not on file  . Transportation needs:    Medical: Not on file    Non-medical: Not on file  Tobacco Use  . Smoking status: Never Smoker  . Smokeless tobacco: Never Used  Substance and Sexual Activity  . Alcohol use: No  . Drug use: No  . Sexual activity: Yes    Partners: Male    Birth control/protection: None    Comment: currently pregnant  Lifestyle  . Physical activity:    Days per week: Not on file    Minutes per session: Not on file  . Stress: Not on file  Relationships  . Social connections:    Talks on phone: Not on file    Gets together: Not on file    Attends religious service: Not on file    Active member of club or organization: Not on file    Attends meetings of clubs or organizations: Not on file    Relationship status: Not on file  .  Intimate partner violence:    Fear of current or ex partner: Not on file    Emotionally abused: Not on file    Physically abused: Not on file    Forced sexual activity: Not on file  Other Topics Concern  . Not on file  Social History Narrative  . Not on file    Past Medical History, Surgical history, Social history, and Family history were reviewed and updated as appropriate.   Please see review of systems for further details on the patient's review from today.   Objective:   Physical Exam:  There were no vitals taken for this visit.  Physical Exam Neurological:     Mental Status: She is alert and oriented to person, place, and time.     Cranial Nerves: No dysarthria.  Psychiatric:        Attention and Perception: Attention normal.        Mood and Affect: Mood is  anxious.        Speech: Speech normal.        Behavior: Behavior is cooperative.        Thought Content: Thought content normal. Thought content is not paranoid or delusional. Thought content does not include homicidal or suicidal ideation.        Cognition and Memory: Cognition and memory normal.        Judgment: Judgment normal.     Comments: Still some OCD significantly.     Lab Review:  No results found for: NA, K, CL, CO2, GLUCOSE, BUN, CREATININE, CALCIUM, PROT, ALBUMIN, AST, ALT, ALKPHOS, BILITOT, GFRNONAA, GFRAA     Component Value Date/Time   WBC 13.5 (H) 10/27/2015 0545   RBC 2.89 (L) 10/27/2015 0545   HGB 9.6 (L) 10/27/2015 0545   HCT 27.1 (L) 10/27/2015 0545   PLT 175 10/27/2015 0545   MCV 93.8 10/27/2015 0545   MCH 33.2 10/27/2015 0545   MCHC 35.4 10/27/2015 0545   RDW 13.6 10/27/2015 0545    No results found for: POCLITH, LITHIUM   No results found for: PHENYTOIN, PHENOBARB, VALPROATE, CBMZ   .res Assessment: Plan:    Mixed obsessional thoughts and acts  Generalized anxiety disorder   Discussed the diagnosis of OCD in detail.  Her symptoms include contamination fears particularly of his sperm in public and some compulsive checking and mild cleaning rituals.  She had questions about the mechanism of action of SSRIs for OCD.  She is interested in getting the maximum benefit.  She is not having side effects.  She is seeing some reduction in anxiety and irritability and depressive symptoms but not a lot of change in the OCD specifically.  Her husband is noticing that she seems somewhat calmer.  Explained in detail that in general patients with OCD respond better and more fully the higher the dosage of sertraline particularly.  She agrees to increase to 200 mg daily.  We discussed the side effects in detail. Increase sertaline to 200 mg to maximize response.Disc SE.  Rec counseling with Dr. Farrel Demark or Buena Irish who specializes.  She says she will plan to do  this.  Again we discussed cognitive behavior therapy techniques and specifically exposure response prevention and I recommended that she immediately start practicing this technique.  FU 8 weeks.  I connected with patient by a video enabled telemedicine application or telephone, with their informed consent, and verified patient privacy and that I am speaking with the correct person using two identifiers.  I was located at  office and patient at home.  Meredith Staggers, MD, DFAPA   Please see After Visit Summary for patient specific instructions.  Future Appointments  Date Time Provider Department Center  04/01/2019 11:00 AM May, [place], [place] [facility] None  04/15/2019 10:00 AM May, [place], [place] [facility] None  04/29/2019 10:00 AM May, [place], [place] [facility] None  05/13/2019 10:00 AM May, [place], [facility] None    No orders of the defined types were placed in this encounter.     -------------------------------

## 2019-04-01 ENCOUNTER — Ambulatory Visit: Payer: Self-pay | Admitting: Psychiatry

## 2019-04-15 ENCOUNTER — Ambulatory Visit: Payer: BLUE CROSS/BLUE SHIELD | Admitting: Psychiatry

## 2019-04-15 ENCOUNTER — Other Ambulatory Visit: Payer: Self-pay

## 2019-04-29 ENCOUNTER — Ambulatory Visit: Payer: Self-pay | Admitting: Psychiatry

## 2019-05-13 ENCOUNTER — Ambulatory Visit: Payer: Self-pay | Admitting: Psychiatry

## 2019-07-01 ENCOUNTER — Telehealth: Payer: Self-pay | Admitting: Psychiatry

## 2019-07-01 ENCOUNTER — Other Ambulatory Visit: Payer: Self-pay

## 2019-07-01 DIAGNOSIS — F422 Mixed obsessional thoughts and acts: Secondary | ICD-10-CM

## 2019-07-01 MED ORDER — SERTRALINE HCL 100 MG PO TABS: 200.0000 mg | ORAL_TABLET | Freq: Every day | ORAL | 0 refills | Status: DC

## 2019-07-01 NOTE — Telephone Encounter (Signed)
Refill request sent

## 2019-07-01 NOTE — Telephone Encounter (Signed)
Pt called to schedule appt and request refill Sertraline @ [facility]

## 2019-08-22 ENCOUNTER — Other Ambulatory Visit: Payer: Self-pay

## 2019-08-22 ENCOUNTER — Encounter: Payer: Self-pay | Admitting: Psychiatry

## 2019-08-22 ENCOUNTER — Ambulatory Visit (INDEPENDENT_AMBULATORY_CARE_PROVIDER_SITE_OTHER): Payer: Self-pay | Admitting: Psychiatry

## 2019-08-22 DIAGNOSIS — F411 Generalized anxiety disorder: Secondary | ICD-10-CM

## 2019-08-22 DIAGNOSIS — F422 Mixed obsessional thoughts and acts: Secondary | ICD-10-CM

## 2019-08-22 MED ORDER — SERTRALINE HCL 100 MG PO TABS: 300.0000 mg | ORAL_TABLET | Freq: Every day | ORAL | 0 refills | Status: DC

## 2019-08-22 NOTE — Progress Notes (Signed)
Rhonda MuirMelissa Bunt [id number] 24-Dec-1980 38 y.o.   Virtual Visit via Telephone Note  I connected with pt by telephone and verified that I am speaking with the correct person using two identifiers.   I discussed the limitations, risks, security and privacy concerns of performing an evaluation and management service by telephone and the availability of in person appointments. I also discussed with the patient that there may be a patient responsible charge related to this service. The patient expressed understanding and agreed to proceed.  I discussed the assessment and treatment plan with the patient. The patient was provided an opportunity to ask questions and all were answered. The patient agreed with the plan and demonstrated an understanding of the instructions.   The patient was advised to call back or seek an in-person evaluation if the symptoms worsen or if the condition fails to improve as anticipated.  I provided 30 minutes of non-face-to-face time during this encounter. The call started at 1045 and ended at 1115. The patient was located at home and the provider was located office.   Subjective:   Patient ID:  Rhonda Parrish is a 38 y.o. (DOB 24-Dec-1980) female.  Chief Complaint:  Chief Complaint  Patient presents with  . Follow-up    Medication Management  . Anxiety    OCD and dosage questions    HPI Rhonda Parrish presents to the office today for follow-up of OCD and generalized anxiety disorder.  She was seen in 2012 but did not follow-up.  She presented again for evaluation and treatment January 14, 2019.  Her last visit was March 2020 and for her residual OCD we increased sertraline to 200 mg daily.  She was to follow-up in 2 months but it is been almost 6 months.  She did increase the dosage.  She thinks OK.  She and H agree there is a marked difference with much less anxiety.  No longer chest pain with anxiety usually.  A little last night.  Obsessions are quieted and can  see them a little more clearly for what they are.  A lot of her issues are germs and isolation has limited fears.  A little more abscessing as she tries to get out a little more.  Still some social anxiety and wishes it were a little quieter with obsessions. Still get triggered.  No sig SE  Patient reports stable mood and denies depressed or irritable moods. Depression resolved.  Patient denies difficulty with sleep initiation or maintenance and overall better with less EMA with panic. Denies appetite disturbance.  Patient reports that energy and motivation have been good.  Patient denies any difficulty with concentration.  Patient denies any suicidal ideation.  Past Psychiatric Medication Trials:  Sertraline and no others.  Review of Systems:  Review of Systems  Cardiovascular: Negative for chest pain.  Neurological: Negative for tremors and weakness.    Medications: I have reviewed the patient's current medications.  Current Outpatient Medications  Medication Sig Dispense Refill  . magnesium 30 MG tablet Take 200 mg by mouth daily.    . Multiple Vitamins-Minerals (MULTIPLE VITAMINS/WOMENS PO) Take by mouth.    . sertraline (ZOLOFT) 100 MG tablet Take 3 tablets (300 mg total) by mouth daily. 270 tablet 0   No current facility-administered medications for this visit.     Medication Side Effects: None  Allergies: No Known Allergies  Past Medical History:  Diagnosis Date  . Medical history non-contributory   . No pertinent past medical history  Family History  Problem Relation Age of Onset  . Cancer Father   . Stroke Maternal Grandmother   . OCD Mother        scrupulosity  . OCD Brother        contamination.    Social History   Socioeconomic History  . Marital status: Married    Spouse name: Not on file  . Number of children: Not on file  . Years of education: Not on file  . Highest education level: Not on file  Occupational History  . Not on file  Social Needs   . Financial resource strain: Not on file  . Food insecurity    Worry: Not on file    Inability: Not on file  . Transportation needs    Medical: Not on file    Non-medical: Not on file  Tobacco Use  . Smoking status: Never Smoker  . Smokeless tobacco: Never Used  Substance and Sexual Activity  . Alcohol use: No  . Drug use: No  . Sexual activity: Yes    Partners: Male    Birth control/protection: None    Comment: currently pregnant  Lifestyle  . Physical activity    Days per week: Not on file    Minutes per session: Not on file  . Stress: Not on file  Relationships  . Social Herbalist on phone: Not on file    Gets together: Not on file    Attends religious service: Not on file    Active member of club or organization: Not on file    Attends meetings of clubs or organizations: Not on file    Relationship status: Not on file  . Intimate partner violence    Fear of current or ex partner: Not on file    Emotionally abused: Not on file    Physically abused: Not on file    Forced sexual activity: Not on file  Other Topics Concern  . Not on file  Social History Narrative  . Not on file    Past Medical History, Surgical history, Social history, and Family history were reviewed and updated as appropriate.   Please see review of systems for further details on the patient's review from today.   Objective:   Physical Exam:  There were no vitals taken for this visit.  Physical Exam Neurological:     Mental Status: She is alert and oriented to person, place, and time.     Cranial Nerves: No dysarthria.  Psychiatric:        Attention and Perception: Attention normal.        Mood and Affect: Mood is anxious.        Speech: Speech normal.        Behavior: Behavior is cooperative.        Thought Content: Thought content normal. Thought content is not paranoid or delusional. Thought content does not include homicidal or suicidal ideation.        Cognition and  Memory: Cognition and memory normal.        Judgment: Judgment normal.     Comments: OCD is a whole lot better with residual sx.     Lab Review:  No results found for: NA, K, CL, CO2, GLUCOSE, BUN, CREATININE, CALCIUM, PROT, ALBUMIN, AST, ALT, ALKPHOS, BILITOT, GFRNONAA, GFRAA     Component Value Date/Time   WBC 13.5 (H) 10/27/2015 0545   RBC 2.89 (L) 10/27/2015 0545   HGB 9.6 (L) 10/27/2015 0545  HCT 27.1 (L) 10/27/2015 0545   PLT 175 10/27/2015 0545   MCV 93.8 10/27/2015 0545   MCH 33.2 10/27/2015 0545   MCHC 35.4 10/27/2015 0545   RDW 13.6 10/27/2015 0545    No results found for: POCLITH, LITHIUM   No results found for: PHENYTOIN, PHENOBARB, VALPROATE, CBMZ   .res Assessment: Plan:    Mixed obsessional thoughts and acts - Plan: sertraline (ZOLOFT) 100 MG tablet  Generalized anxiety disorder   Discussed the diagnosis of OCD in detail.  Her symptoms include contamination fears particularly of his sperm in public and some compulsive checking and mild cleaning rituals.  She had questions about the mechanism of action of SSRIs for OCD.  She is interested in getting the maximum benefit.  She is not having side effects.  Both she and her husband have noticed a marked benefit in the OCD and overall generalized anxiety but she would like further improvement if possible explained in detail that in general patients with OCD respond better and more fully the higher the dosage of sertraline particularly.  She agrees to increase again.  Disc study using doses up to 400 mg daily in TR OCD.  She wants to increase it.  We discussed the side effects in detail. Disc serotonin syndrome and SE and call if a problem. Increase sertaline to 250mg  to maximize response.Disc SE.  Continue for 2 mos at 250 mg and if tolerated but not sufficient success then increase to 300 mg daily.  Extensive discussion about the treatment course and plan including duration of treatment.  Answered questions about  whether to possibly reduce later once sx controlled.  Answered questions about 5 HTP.  And which book to read.  Hasn't read the Fabian Sharp book yet.  Discussed other options for treatment resistant OCD such as the use of dopamine blocking medications like risperidone.  Discussed their side effects.  There is side effects are going to be greater than the risk of increasing Zoloft so she we will defer risperidone.  Answered questions about occ wine glass.  Rec counseling with Dr. Farrel Demark or Buena Irish who specializes.  She says she will plan to do this.  She hasn't done it but looking more into it.  Again we discussed cognitive behavior therapy techniques and specifically exposure response prevention and I recommended that she immediately start practicing this technique.  Greater than 50% of 30-minute phone to phone time with patient was spent on counseling and coordination of care.   FU 16 weeks.  Meredith Staggers, MD, DFAPA   Please see After Visit Summary for patient specific instructions.  No future appointments.  No orders of the defined types were placed in this encounter.     -------------------------------

## 2019-09-21 ENCOUNTER — Other Ambulatory Visit: Payer: Self-pay | Admitting: Psychiatry

## 2019-09-21 DIAGNOSIS — F422 Mixed obsessional thoughts and acts: Secondary | ICD-10-CM

## 2019-12-24 ENCOUNTER — Other Ambulatory Visit: Payer: Self-pay | Admitting: Psychiatry

## 2019-12-24 DIAGNOSIS — F422 Mixed obsessional thoughts and acts: Secondary | ICD-10-CM

## 2020-01-14 ENCOUNTER — Ambulatory Visit (INDEPENDENT_AMBULATORY_CARE_PROVIDER_SITE_OTHER): Payer: Self-pay | Admitting: Psychiatry

## 2020-01-14 ENCOUNTER — Encounter: Payer: Self-pay | Admitting: Psychiatry

## 2020-01-14 DIAGNOSIS — F330 Major depressive disorder, recurrent, mild: Secondary | ICD-10-CM

## 2020-01-14 DIAGNOSIS — F411 Generalized anxiety disorder: Secondary | ICD-10-CM

## 2020-01-14 DIAGNOSIS — F422 Mixed obsessional thoughts and acts: Secondary | ICD-10-CM

## 2020-01-14 NOTE — Progress Notes (Signed)
Rhonda Parrish [id number] 1981-07-31 39 y.o.   Virtual Visit via Telephone Note  I connected with pt by telephone and verified that I am speaking with the correct person using two identifiers.   I discussed the limitations, risks, security and privacy concerns of performing an evaluation and management service by telephone and the availability of in person appointments. I also discussed with the patient that there may be a patient responsible charge related to this service. The patient expressed understanding and agreed to proceed.  I discussed the assessment and treatment plan with the patient. The patient was provided an opportunity to ask questions and all were answered. The patient agreed with the plan and demonstrated an understanding of the instructions.   The patient was advised to call back or seek an in-person evaluation if the symptoms worsen or if the condition fails to improve as anticipated.  I provided 30 minutes of non-face-to-face time during this encounter. The call started at 1045 and ended at 1115. The patient was located at home and the provider was located office.   Subjective:   Patient ID:  Rhonda Parrish is a 39 y.o. (DOB May 12, 1981) female.  Chief Complaint:  Chief Complaint  Patient presents with  . Follow-up    Medication Management  . Other    OCD    HPI [name] presents to the office today for follow-up of OCD and generalized anxiety disorder.  She was seen in 2012 but did not follow-up.  She presented again for evaluation and treatment January 14, 2019.  At Her  visit March 2020 and for her residual OCD we increased sertraline to 200 mg daily.  She was to follow-up in 2 months but it is been almost 6 months. She did increase the dosage.  She thinks OK.  She and H agree there is a marked difference with much less anxiety.  No longer chest pain with anxiety usually.  A little last night.  Obsessions are quieted and can see them a little more clearly  for what they are.  A lot of her issues are germs and isolation has limited fears.  A little more obsessing as she tries to get out a little more.  Still some social anxiety and wishes it were a little quieter with obsessions. Still get triggered.  No sig SE  Therefore at her last visit September 2020 for residual OCD she was encouraged to increase sertraline to 250 mg daily for 2 months and if tolerated and needed increase to 300 mg daily.  We discussed studies that support higher doses of sertraline specifically for OCD.  Up to 300 mg daily for a couple of months.  Increase to 250 made a difference but not much change at 300 mg daily.  A whole lot better than a year ago.  Still can struggle with depression.  Wonders if it's a SE.   Last week dark cloud and less motivation.  This week totally lifted.  Maybe hormonal.  No triggers.  H wondered if stopping D could make a difference. Better recognizing OCD with residual thoughts.  Lower sex drive.  No blunting.  Still scared to touch the trashcan.    Patient reports stable mood and denies depressed or irritable moods. Depression intermittent.  Patient denies difficulty with sleep initiation or maintenance and overall better with less EMA with panic. Denies appetite disturbance.  Patient reports that energy and motivation have been good.  Patient denies any difficulty with concentration.  Patient denies any suicidal  ideation.  Past Psychiatric Medication Trials:  Sertraline and no others.  Review of Systems:  Review of Systems  Cardiovascular: Negative for chest pain.  Neurological: Negative for tremors and weakness.    Medications: I have reviewed the patient's current medications.  Current Outpatient Medications  Medication Sig Dispense Refill  . Cholecalciferol (VITAMIN D) 125 MCG (5000 UT) CAPS Take by mouth.    . Multiple Vitamins-Minerals (MULTIPLE VITAMINS/WOMENS PO) Take by mouth.    . Probiotic Product (PROBIOTIC-10 PO) Take by mouth.     . sertraline (ZOLOFT) 100 MG tablet TAKE THREE TABLETS BY MOUTH DAILY 270 tablet 0   No current facility-administered medications for this visit.    Medication Side Effects: None, lower libido  Allergies: No Known Allergies  Past Medical History:  Diagnosis Date  . Medical history non-contributory   . No pertinent past medical history     Family History  Problem Relation Age of Onset  . Cancer Father   . Stroke Maternal Grandmother   . OCD Mother        scrupulosity  . OCD Brother        contamination.    Social History   Socioeconomic History  . Marital status: Married    Spouse name: Not on file  . Number of children: Not on file  . Years of education: Not on file  . Highest education level: Not on file  Occupational History  . Not on file  Tobacco Use  . Smoking status: Never Smoker  . Smokeless tobacco: Never Used  Substance and Sexual Activity  . Alcohol use: No  . Drug use: No  . Sexual activity: Yes    Partners: Male    Birth control/protection: None    Comment: currently pregnant  Other Topics Concern  . Not on file  Social History Narrative  . Not on file   Social Determinants of Health   Financial Resource Strain:   . Difficulty of Paying Living Expenses: Not on file  Food Insecurity:   . Worried About Programme researcher, broadcasting/film/video in the Last Year: Not on file  . Ran Out of Food in the Last Year: Not on file  Transportation Needs:   . Lack of Transportation (Medical): Not on file  . Lack of Transportation (Non-Medical): Not on file  Physical Activity:   . Days of Exercise per Week: Not on file  . Minutes of Exercise per Session: Not on file  Stress:   . Feeling of Stress : Not on file  Social Connections:   . Frequency of Communication with Friends and Family: Not on file  . Frequency of Social Gatherings with Friends and Family: Not on file  . Attends Religious Services: Not on file  . Active Member of Clubs or Organizations: Not on file  .  Attends Banker Meetings: Not on file  . Marital Status: Not on file  Intimate Partner Violence:   . Fear of Current or Ex-Partner: Not on file  . Emotionally Abused: Not on file  . Physically Abused: Not on file  . Sexually Abused: Not on file    Past Medical History, Surgical history, Social history, and Family history were reviewed and updated as appropriate.   Please see review of systems for further details on the patient's review from today.   Objective:   Physical Exam:  There were no vitals taken for this visit.  Physical Exam Neurological:     Mental Status: She is alert and  oriented to person, place, and time.     Cranial Nerves: No dysarthria.  Psychiatric:        Attention and Perception: Attention normal.        Mood and Affect: Mood is anxious.        Speech: Speech normal.        Behavior: Behavior is cooperative.        Thought Content: Thought content normal. Thought content is not paranoid or delusional. Thought content does not include homicidal or suicidal ideation.        Cognition and Memory: Cognition and memory normal.        Judgment: Judgment normal.     Comments: OCD is a whole lot better with residual sx.     Lab Review:  No results found for: NA, K, CL, CO2, GLUCOSE, BUN, CREATININE, CALCIUM, PROT, ALBUMIN, AST, ALT, ALKPHOS, BILITOT, GFRNONAA, GFRAA     Component Value Date/Time   WBC 13.5 (H) 10/27/2015 0545   RBC 2.89 (L) 10/27/2015 0545   HGB 9.6 (L) 10/27/2015 0545   HCT 27.1 (L) 10/27/2015 0545   PLT 175 10/27/2015 0545   MCV 93.8 10/27/2015 0545   MCH 33.2 10/27/2015 0545   MCHC 35.4 10/27/2015 0545   RDW 13.6 10/27/2015 0545    No results found for: POCLITH, LITHIUM   No results found for: PHENYTOIN, PHENOBARB, VALPROATE, CBMZ   .res Assessment: Plan:    Mixed obsessional thoughts and acts  Generalized anxiety disorder  Mild recurrent major depression ([place])   Greater than 50% of 30-minute non-face to face  time with patient was spent on counseling and coordination of care. We Discussed the diagnosis of OCD in detail.  Her symptoms include contamination fears particularly of his sperm in public and some compulsive checking and mild cleaning rituals.  She had questions about the mechanism of action of SSRIs for OCD.  She is interested in getting the maximum benefit.  She is not having side effects.  Both she and her husband have noticed a marked benefit in the OCD and overall generalized anxiety but she would like further improvement if possible.  explained in detail that in general patients with OCD respond better and more fully the higher the dosage of sertraline particularly.  She has seen additional improvement since increasing to 250 mg daily disc study using doses up to 400 mg daily in TR OCD.  She did not seek additional benefit at 300 mg over the last 2 months..  We discussed the side effects in detail. Disc serotonin syndrome and SE and call if a problem. We discussed the pros and cons of perhaps reducing the dose back to 250 to reduce sexual side effects although that did not seem significantly different at that dose either.  In order to maximize benefit and avoid additional medication she prefers to continue 300 mg daily for a few more months.  Option Abilify potentiation for residual depression and OCD.  Another option switch to Luvox or paroxetine.  This was discussed and while Luvox may have less sexual side effect it is also typically less effective for depression while paroxetine is likely to have at least is much side effect.  Extensive discussion about the treatment course and plan including duration of treatment.  Answered questions about whether to possibly reduce later once sx controlled.  Disc ideal and realistic goals with OCD treatment.  Room for improvement with higher dose sertraline.bed  She has some periods of depression but is not  consistently depressed.  Answered questions about  vitamin D.  And which book to read.  Hasn't read the Fabian Sharp book yet.  Discussed other options for treatment resistant OCD such as the use of dopamine blocking medications like risperidone.  Discussed their side effects.  There is side effects are going to be greater than the risk of increasing Zoloft so she we will defer risperidone.  Answered questions about occ wine glass.  Rec counseling with Dr. Farrel Demark or Buena Irish who specializes.  She says she will plan to do this.  She hasn't done it but looking more into it.  Again we discussed cognitive behavior therapy techniques and specifically exposure response prevention and I recommended that she immediately start practicing this technique.  Greater than 50% of 30-minute phone to phone time with patient was spent on counseling and coordination of care.   FU 2-3 mos  Meredith Staggers, MD, DFAPA   Please see After Visit Summary for patient specific instructions.  No future appointments.  No orders of the defined types were placed in this encounter.     -------------------------------

## 2020-03-26 ENCOUNTER — Other Ambulatory Visit: Payer: Self-pay

## 2020-03-26 ENCOUNTER — Telehealth: Payer: Self-pay | Admitting: Psychiatry

## 2020-03-26 DIAGNOSIS — F422 Mixed obsessional thoughts and acts: Secondary | ICD-10-CM

## 2020-03-26 MED ORDER — SERTRALINE HCL 100 MG PO TABS: 300.0000 mg | ORAL_TABLET | Freq: Every day | ORAL | 0 refills | Status: DC

## 2020-03-26 NOTE — Telephone Encounter (Signed)
Patient notified

## 2020-03-26 NOTE — Telephone Encounter (Signed)
Patient called and said that she needs a refill on her zoloft 100 mg to be sent to the [facility] on [address]. She has an appointment on 5/27.She is completely out and would like a phone call to let her know it has been sent in. Please call her at [phone]

## 2020-03-26 NOTE — Telephone Encounter (Signed)
Rx submitted to [facility]

## 2020-05-12 ENCOUNTER — Encounter: Payer: Self-pay | Admitting: Psychiatry

## 2020-05-12 ENCOUNTER — Telehealth (INDEPENDENT_AMBULATORY_CARE_PROVIDER_SITE_OTHER): Payer: Self-pay | Admitting: Psychiatry

## 2020-05-12 DIAGNOSIS — F422 Mixed obsessional thoughts and acts: Secondary | ICD-10-CM

## 2020-05-12 DIAGNOSIS — F411 Generalized anxiety disorder: Secondary | ICD-10-CM

## 2020-05-12 DIAGNOSIS — F3342 Major depressive disorder, recurrent, in full remission: Secondary | ICD-10-CM

## 2020-05-12 MED ORDER — SERTRALINE HCL 100 MG PO TABS: 300.0000 mg | ORAL_TABLET | Freq: Every day | ORAL | 1 refills | Status: DC

## 2020-05-12 NOTE — Progress Notes (Signed)
Shetara Parrish [id number] Jan 11, 1981 39 y.o.   I connected with  Rhonda Parrish on 05/12/20 by a video enabled telemedicine application and verified that I am speaking with the correct person using two identifiers.   I discussed the limitations of evaluation and management by telemedicine. The patient expressed understanding and agreed to proceed.   Subjective:   Patient ID:  Rhonda Parrish is a 40 y.o. (DOB 1981/03/04) female.  Chief Complaint:  Chief Complaint  Patient presents with  . Follow-up    OCD    HPI Rhonda Parrish presents to the office today for follow-up of OCD and generalized anxiety disorder.  She was seen in 2012 but did not follow-up.  She presented again for evaluation and treatment January 14, 2019.  At Her  visit March 2020 and for her residual OCD we increased sertraline to 200 mg daily.  She was to follow-up in 2 months but it is been almost 6 months. She did increase the dosage.  She thinks OK.  She and H agree there is a marked difference with much less anxiety.  No longer chest pain with anxiety usually.  A little last night.  Obsessions are quieted and can see them a little more clearly for what they are.  A lot of her issues are germs and isolation has limited fears.  A little more obsessing as she tries to get out a little more.  Still some social anxiety and wishes it were a little quieter with obsessions. Still get triggered.  No sig SE  Therefore at her last visit September 2020 for residual OCD she was encouraged to increase sertraline to 250 mg daily for 2 months and if tolerated and needed increase to 300 mg daily.  We discussed studies that support higher doses of sertraline specifically for OCD.  Up to 300 mg daily for a couple of months.  Increase to 250 made a difference but not much change at 300 mg daily.  A whole lot better than a year ago.  Still can struggle with depression.  Wonders if it's a SE.   Last week dark cloud and less motivation.  This  week totally lifted.  Maybe hormonal.  No triggers.  H wondered if stopping D could make a difference. Better recognizing OCD with residual thoughts.  Lower sex drive.  No blunting.   05/12/20 appt reports doing well overall.   OCD 3-4/10 and manageable with little intensity and urgency.  Still aware of certain triggers but doesn't have to respond.  Able to delay response through ERP work.  Ex germs and can delay washing.Minimal SE.  covid is not that triggering.  Worried more about BA germs but not Covid.  Patient reports stable mood and denies depressed or irritable moods. Depression intermittent.  Patient denies difficulty with sleep initiation or maintenance and overall better with less EMA with panic. Denies appetite disturbance.  Patient reports that energy and motivation have been good.  Patient denies any difficulty with concentration.  Patient denies any suicidal ideation. Normal sleep with kids.  Past Psychiatric Medication Trials:  Sertraline and no others.  Review of Systems:  Review of Systems  Cardiovascular: Negative for chest pain.  Gastrointestinal: Negative for diarrhea.  Neurological: Negative for tremors and weakness.    Medications: I have reviewed the patient's current medications.  Current Outpatient Medications  Medication Sig Dispense Refill  . Cholecalciferol (VITAMIN D) 125 MCG (5000 UT) CAPS Take by mouth.    . Multiple Vitamins-Minerals (MULTIPLE VITAMINS/WOMENS PO)  Take by mouth.    . Probiotic Product (PROBIOTIC-10 PO) Take by mouth.    . sertraline (ZOLOFT) 100 MG tablet Take 3 tablets (300 mg total) by mouth daily. 270 tablet 1   No current facility-administered medications for this visit.    Medication Side Effects: None, lower libido  Allergies: No Known Allergies  Past Medical History:  Diagnosis Date  . Medical history non-contributory   . No pertinent past medical history     Family History  Problem Relation Age of Onset  . Cancer Father    . Stroke Maternal Grandmother   . OCD Mother        scrupulosity  . OCD Brother        contamination.    Social History   Socioeconomic History  . Marital status: Married    Spouse name: Not on file  . Number of children: Not on file  . Years of education: Not on file  . Highest education level: Not on file  Occupational History  . Not on file  Tobacco Use  . Smoking status: Never Smoker  . Smokeless tobacco: Never Used  Substance and Sexual Activity  . Alcohol use: No  . Drug use: No  . Sexual activity: Yes    Partners: Male    Birth control/protection: None    Comment: currently pregnant  Other Topics Concern  . Not on file  Social History Narrative  . Not on file   Social Determinants of Health   Financial Resource Strain:   . Difficulty of Paying Living Expenses:   Food Insecurity:   . Worried About Charity fundraiser in the Last Year:   . Arboriculturist in the Last Year:   Transportation Needs:   . Film/video editor (Medical):   [name] Lack of Transportation (Non-Medical):   Physical Activity:   . Days of Exercise per Week:   . Minutes of Exercise per Session:   Stress:   . Feeling of Stress :   Social Connections:   . Frequency of Communication with Friends and Family:   . Frequency of Social Gatherings with Friends and Family:   . Attends Religious Services:   . Active Member of Clubs or Organizations:   . Attends Archivist Meetings:   [name] Marital Status:   Intimate Partner Violence:   . Fear of Current or Ex-Partner:   . Emotionally Abused:   [name] Physically Abused:   . Sexually Abused:     Past Medical History, Surgical history, Social history, and Family history were reviewed and updated as appropriate.   Please see review of systems for further details on the patient's review from today.   Objective:   Physical Exam:  There were no vitals taken for this visit.  Physical Exam Neurological:     Mental Status: She is alert and  oriented to person, place, and time.     Cranial Nerves: No dysarthria.  Psychiatric:        Attention and Perception: Attention normal.        Mood and Affect: Mood is anxious. Mood is not depressed.        Speech: Speech normal.        Behavior: Behavior is cooperative.        Thought Content: Thought content normal. Thought content is not paranoid or delusional. Thought content does not include homicidal or suicidal ideation.        Cognition and Memory: Cognition and memory  normal.        Judgment: Judgment normal.     Comments: OCD is a whole lot better with residual sx.     Lab Review:  No results found for: NA, K, CL, CO2, GLUCOSE, BUN, CREATININE, CALCIUM, PROT, ALBUMIN, AST, ALT, ALKPHOS, BILITOT, GFRNONAA, GFRAA     Component Value Date/Time   WBC 13.5 (H) 10/27/2015 0545   RBC 2.89 (L) 10/27/2015 0545   HGB 9.6 (L) 10/27/2015 0545   HCT 27.1 (L) 10/27/2015 0545   PLT 175 10/27/2015 0545   MCV 93.8 10/27/2015 0545   MCH 33.2 10/27/2015 0545   MCHC 35.4 10/27/2015 0545   RDW 13.6 10/27/2015 0545    No results found for: POCLITH, LITHIUM   No results found for: PHENYTOIN, PHENOBARB, VALPROATE, CBMZ   .res Assessment: Plan:    Mixed obsessional thoughts and acts - Plan: sertraline (ZOLOFT) 100 MG tablet  Generalized anxiety disorder  Recurrent major depression in full remission ([facility])   Greater than 50% of 30-minute non-face to face time with patient was spent on counseling and coordination of care. We Discussed the diagnosis of OCD in detail.  Her symptoms include contamination fears particularly of his sperm in public and some compulsive checking and mild cleaning rituals.  She had questions about the mechanism of action of SSRIs for OCD.  She appears to be getting the maximum benefit.  She is satisfied with the response.  She is not having side effects.  Both she and her husband have noticed a marked benefit in the OCD and overall generalized anxiety but she would  like further improvement if possible.  explained in detail that in general patients with OCD respond better and more fully the higher the dosage of sertraline particularly she has seen additional improvement on this high dose of sertraline over the last months since the last appointment.  We discussed the side effects in detail. Disc serotonin syndrome and SE and call if a problem.  Option Abilify potentiation for residual depression and OCD.  Another option switch to Luvox or paroxetine.  This was discussed and while Luvox may have less sexual side effect it is also typically less effective for depression while paroxetine is likely to have at least is much side effect.  Extensive discussion about the treatment course and plan including duration of treatment.  Answered questions about whether to possibly reduce later once sx controlled.  Disc ideal and realistic goals with OCD treatment.  No significant depressive episodes since her last visit.  Again we discussed cognitive behavior therapy techniques and specifically exposure response prevention and I recommended that she immediately start practicing this technique.  FU 6 months because of stability  Meredith Staggers, MD, DFAPA   Please see After Visit Summary for patient specific instructions.  No future appointments.  No orders of the defined types were placed in this encounter.     -------------------------------

## 2021-01-17 ENCOUNTER — Telehealth: Payer: Self-pay | Admitting: Psychiatry

## 2021-01-17 ENCOUNTER — Other Ambulatory Visit: Payer: Self-pay | Admitting: Psychiatry

## 2021-01-17 DIAGNOSIS — F422 Mixed obsessional thoughts and acts: Secondary | ICD-10-CM

## 2021-01-17 MED ORDER — SERTRALINE HCL 100 MG PO TABS: 300.0000 mg | ORAL_TABLET | Freq: Every day | ORAL | 0 refills | Status: DC

## 2021-01-17 NOTE — Telephone Encounter (Signed)
Next appt is 03/22/21. Requesting refill on Sertraline called to [facility] on [address], Phone # is [phone].

## 2021-03-22 ENCOUNTER — Encounter: Payer: Self-pay | Admitting: Psychiatry

## 2021-03-22 ENCOUNTER — Ambulatory Visit (INDEPENDENT_AMBULATORY_CARE_PROVIDER_SITE_OTHER): Payer: Self-pay | Admitting: Psychiatry

## 2021-03-22 ENCOUNTER — Other Ambulatory Visit: Payer: Self-pay

## 2021-03-22 DIAGNOSIS — F411 Generalized anxiety disorder: Secondary | ICD-10-CM

## 2021-03-22 DIAGNOSIS — F3342 Major depressive disorder, recurrent, in full remission: Secondary | ICD-10-CM

## 2021-03-22 DIAGNOSIS — F422 Mixed obsessional thoughts and acts: Secondary | ICD-10-CM

## 2021-03-22 MED ORDER — SERTRALINE HCL 100 MG PO TABS: 300.0000 mg | ORAL_TABLET | Freq: Every day | ORAL | 3 refills | Status: DC

## 2021-03-22 NOTE — Progress Notes (Signed)
Rhonda Parrish [id number] April 01, 1981 40 y.o.      Subjective:   Patient ID:  Rhonda Parrish is a 40 y.o. (DOB 1981-06-09) female.  Chief Complaint:  Chief Complaint  Patient presents with  . Follow-up  . Mixed obsessional thoughts and acts  . Anxiety    HPI Rhonda Parrish presents to the office today for follow-up of OCD and generalized anxiety disorder.  She was seen in 2012 but did not follow-up.  She presented again for evaluation and treatment January 14, 2019.  At Her  visit March 2020 and for her residual OCD we increased sertraline to 200 mg daily.  She was to follow-up in 2 months but it is been almost 6 months. She did increase the dosage.  She thinks OK.  She and H agree there is a marked difference with much less anxiety.  No longer chest pain with anxiety usually.  A little last night.  Obsessions are quieted and can see them a little more clearly for what they are.  A lot of her issues are germs and isolation has limited fears.  A little more obsessing as she tries to get out a little more.  Still some social anxiety and wishes it were a little quieter with obsessions. Still get triggered.  No sig SE  Therefore at her last visit September 2020 for residual OCD she was encouraged to increase sertraline to 250 mg daily for 2 months and if tolerated and needed increase to 300 mg daily.  We discussed studies that support higher doses of sertraline specifically for OCD.  Up to 300 mg daily for a couple of months late 2020.[name]  Increase to 250 made a difference but not much change at 300 mg daily.  A whole lot better than a year ago.  Still can struggle with depression.  Wonders if it's a SE.   Last week dark cloud and less motivation.  This week totally lifted.  Maybe hormonal.  No triggers.  H wondered if stopping D could make a difference. Better recognizing OCD with residual thoughts.  Lower sex drive.  No blunting.   05/12/20 appt reports doing well overall.   OCD 3-4/10 and  manageable with little intensity and urgency.  Still aware of certain triggers but doesn't have to respond.  Able to delay response through ERP work.  Ex germs and can delay washing.Minimal SE.  covid is not that triggering.  Worried more about BA germs but not Covid. Plan: no med changes  03/22/2021 appointment with the following noted: Better with anxiety and OCD. Sertraline has taken off the edger. Emotions more stable and H notices.   Able to do a lot more and handle triggers.  Still has feelings of contamination.  Can put it in a box better saying it was OCD.  Wishes it was less than it is.  Still make decisions based on OCD thoughts.  Am I clean enough to do laundry.  Can take kids to public restroom.  No excessive showering.  SE ? Mild blunting, sex drive  Patient reports stable mood and denies depressed or irritable moods. Depression intermittent.  Patient denies difficulty with sleep initiation or maintenance and overall better with less EMA with panic. Denies appetite disturbance.  Patient reports that energy and motivation have been good.  Patient denies any difficulty with concentration.  Patient denies any suicidal ideation. Normal sleep with kids.  Past Psychiatric Medication Trials:  Sertraline and no others. Hx Dr. Farrel Demark but not on meds  at the time.  Review of Systems:  Review of Systems  Cardiovascular: Negative for chest pain.  Gastrointestinal: Negative for diarrhea.  Neurological: Negative for tremors and weakness.  Psychiatric/Behavioral: The patient is nervous/anxious.     Medications: I have reviewed the patient's current medications.  Current Outpatient Medications  Medication Sig Dispense Refill  . Cholecalciferol (VITAMIN D) 125 MCG (5000 UT) CAPS Take by mouth.    . Multiple Vitamins-Minerals (MULTIPLE VITAMINS/WOMENS PO) Take by mouth.    . Probiotic Product (PROBIOTIC-10 PO) Take by mouth.    . sertraline (ZOLOFT) 100 MG tablet Take 3 tablets (300 mg total) by  mouth daily. 270 tablet 3   No current facility-administered medications for this visit.    Medication Side Effects: None, lower libido  Allergies: No Known Allergies  Past Medical History:  Diagnosis Date  . Medical history non-contributory   . No pertinent past medical history     Family History  Problem Relation Age of Onset  . Cancer Father   . Stroke Maternal Grandmother   . OCD Mother        scrupulosity  . OCD Brother        contamination.    Social History   Socioeconomic History  . Marital status: Married    Spouse name: Not on file  . Number of children: Not on file  . Years of education: Not on file  . Highest education level: Not on file  Occupational History  . Not on file  Tobacco Use  . Smoking status: Never Smoker  . Smokeless tobacco: Never Used  Substance and Sexual Activity  . Alcohol use: No  . Drug use: No  . Sexual activity: Yes    Partners: Male    Birth control/protection: None    Comment: currently pregnant  Other Topics Concern  . Not on file  Social History Narrative  . Not on file   Social Determinants of Health   Financial Resource Strain: Not on file  Food Insecurity: Not on file  Transportation Needs: Not on file  Physical Activity: Not on file  Stress: Not on file  Social Connections: Not on file  Intimate Partner Violence: Not on file    Past Medical History, Surgical history, Social history, and Family history were reviewed and updated as appropriate.   Please see review of systems for further details on the patient's review from today.   Objective:   Physical Exam:  There were no vitals taken for this visit.  Physical Exam Neurological:     Mental Status: She is alert and oriented to person, place, and time.     Cranial Nerves: No dysarthria.  Psychiatric:        Attention and Perception: Attention normal.        Mood and Affect: Mood is anxious. Mood is not depressed.        Speech: Speech normal.         Behavior: Behavior is cooperative.        Thought Content: Thought content normal. Thought content is not paranoid or delusional. Thought content does not include homicidal or suicidal ideation.        Cognition and Memory: Cognition and memory normal.        Judgment: Judgment normal.     Comments: OCD is a whole lot better with residual sx.     Lab Review:  No results found for: NA, K, CL, CO2, GLUCOSE, BUN, CREATININE, CALCIUM, PROT, ALBUMIN, AST, ALT, ALKPHOS,  Rhonda Parrish, GFRAA     Component Value Date/Time   WBC 13.5 (H) 10/27/2015 0545   RBC 2.89 (L) 10/27/2015 0545   HGB 9.6 (L) 10/27/2015 0545   HCT 27.1 (L) 10/27/2015 0545   PLT 175 10/27/2015 0545   MCV 93.8 10/27/2015 0545   MCH 33.2 10/27/2015 0545   MCHC 35.4 10/27/2015 0545   RDW 13.6 10/27/2015 0545    No results found for: POCLITH, LITHIUM   No results found for: PHENYTOIN, PHENOBARB, VALPROATE, CBMZ   .res Assessment: Plan:    Mixed obsessional thoughts and acts - Plan: sertraline (ZOLOFT) 100 MG tablet  Recurrent major depression in full remission (HCC)  Generalized anxiety disorder   Greater than 50% of 30-minute non-face to face time with patient was spent on counseling and coordination of care. We Discussed the diagnosis of OCD in detail.  Her symptoms include contamination fears particularly of his sperm in public and some compulsive checking and mild cleaning rituals.  She had questions about the mechanism of action of SSRIs for OCD.  She appears to be getting the maximum benefit.  She is satisfied with the response.  She is not having side effects.  Both she and her husband have noticed a marked benefit in the OCD and overall generalized anxiety but she would like further improvement if possible.  explained in detail that in general patients with OCD respond better and more fully the higher the dosage of sertraline particularly she has seen additional improvement on this high dose of sertraline over  the last months since the last appointment.  We discussed the side effects in detail. Disc serotonin syndrome and SE and call if a problem.  Disc dealing with sexual SE with drug holiday.  Extensive discussion about the treatment course and plan including duration of treatment.  Answered questions about whether to possibly reduce later once sx controlled.  Disc ideal and realistic goals with OCD treatment.   Disc D and 40 yo questions and some concern over some OCD issues.  Continue sertraline 300 mg for another year.  No significant depressive episodes since her last visit.   Again we discussed cognitive behavior therapy techniques and specifically exposure response prevention and I recommended that she immediately start practicing this technique.  FU 12 mos  Meredith Staggers, MD, DFAPA   Please see After Visit Summary for patient specific instructions.  No future appointments.  No orders of the defined types were placed in this encounter.     -------------------------------

## 2022-03-21 ENCOUNTER — Ambulatory Visit (INDEPENDENT_AMBULATORY_CARE_PROVIDER_SITE_OTHER): Payer: Self-pay | Admitting: Psychiatry

## 2022-03-21 ENCOUNTER — Encounter: Payer: Self-pay | Admitting: Psychiatry

## 2022-03-21 DIAGNOSIS — F422 Mixed obsessional thoughts and acts: Secondary | ICD-10-CM

## 2022-03-21 DIAGNOSIS — F3342 Major depressive disorder, recurrent, in full remission: Secondary | ICD-10-CM

## 2022-03-21 DIAGNOSIS — F411 Generalized anxiety disorder: Secondary | ICD-10-CM

## 2022-03-21 MED ORDER — SERTRALINE HCL 100 MG PO TABS: 300.0000 mg | ORAL_TABLET | Freq: Every day | ORAL | 1 refills | Status: DC

## 2022-03-21 NOTE — Progress Notes (Signed)
Rhonda Parrish ?[id number] ?02-11-1981 ?41 y.o.  ? ? ? ? ?Subjective:  ? ?Patient ID:  Rhonda MuirMelissa Wike is a 41 y.o. (DOB 02-11-1981) female. ? ?Chief Complaint:  ?Chief Complaint  ?Patient presents with  ? Follow-up  ? Medication Reaction  ? ? ?HPI ?Rhonda MuirMelissa Goodspeed presents to the office today for follow-up of OCD and generalized anxiety disorder.  She was seen in 2012 but did not follow-up.  She presented again for evaluation and treatment January 14, 2019. ? ?At Her  visit March 2020 and for her residual OCD we increased sertraline to 200 mg daily.  She was to follow-up in 2 months but it is been almost 6 months. ?She did increase the dosage.  She thinks OK.  She and H agree there is a marked difference with much less anxiety.  No longer chest pain with anxiety usually.  A little last night.  Obsessions are quieted and can see them a little more clearly for what they are.  A lot of her issues are germs and isolation has limited fears.  A little more obsessing as she tries to get out a little more.  Still some social anxiety and wishes it were a little quieter with obsessions. Still get triggered.  ?No sig SE ? ?Therefore at her last visit September 2020 for residual OCD she was encouraged to increase sertraline to 250 mg daily for 2 months and if tolerated and needed increase to 300 mg daily.  We discussed studies that support higher doses of sertraline specifically for OCD. ? ?Up to 300 mg daily for a couple of months late 2020.[name].  Increase to 250 made a difference but not much change at 300 mg daily.  A whole lot better than a year ago.  Still can struggle with depression.  Wonders if it's a SE.   Last week dark cloud and less motivation.  This week totally lifted.  Maybe hormonal.  No triggers.  H wondered if stopping D could make a difference. ?Better recognizing OCD with residual thoughts.  Lower sex drive.  No blunting.  ? ?05/12/20 appt reports doing well overall.   OCD 3-4/10 and manageable with little intensity  and urgency.  Still aware of certain triggers but doesn't have to respond.  Able to delay response through ERP work.  Ex germs and can delay washing.Minimal SE.  covid is not that triggering.  Worried more about BA germs but not Covid. ?Plan: no med changes ? ?03/22/2021 appointment with the following noted: ?Better with anxiety and OCD. Sertraline has taken off the edger. Emotions more stable and H notices.   ?Able to do a lot more and handle triggers.  Still has feelings of contamination.  Can put it in a box better saying it was OCD.  Wishes it was less than it is.  Still make decisions based on OCD thoughts.  Am I clean enough to do laundry.  Can take kids to public restroom.  No excessive showering.  ?SE ? Mild blunting, sex drive ?Plan continue sertraline 300 mg daily which is above the usual max.  She is aware. ? ?03/21/2022 appointment with the following noted: ?Good and the same and much better than start.  Still OCD more than she'd like and still have to think about things and can interfere with activities.  Will still do things in certain order DT OCD.   ?Continues sertraline 300 mg daily.  No sig SE except sexual.  Cleanliness obsessions.  Wishes she didn't notice. ? ?  Patient reports stable mood and denies depressed or irritable moods. Depression intermittent.  Patient denies difficulty with sleep initiation or maintenance and overall better with less EMA with panic. Denies appetite disturbance.  Patient reports that energy and motivation have been good.  Patient denies any difficulty with concentration.  Patient denies any suicidal ideation. Normal sleep with kids. ? ?Past Psychiatric Medication Trials:  Sertraline 300 and no others. ?Hx Dr. Farrel Demark but not on meds at the time. ? ?B has OCD ? med ? ?Review of Systems:  ?Review of Systems  ?Gastrointestinal:  Negative for diarrhea.  ?Neurological:  Negative for tremors and weakness.  ?Psychiatric/Behavioral:  The patient is nervous/anxious.   ? ?Medications:  I have reviewed the patient's current medications. ? ?Current Outpatient Medications  ?Medication Sig Dispense Refill  ? Cholecalciferol (VITAMIN D) 125 MCG (5000 UT) CAPS Take by mouth.    ? Multiple Vitamins-Minerals (MULTIPLE VITAMINS/WOMENS PO) Take by mouth.    ? Probiotic Product (PROBIOTIC-10 PO) Take by mouth.    ? sertraline (ZOLOFT) 100 MG tablet Take 3 tablets (300 mg total) by mouth daily. 270 tablet 1  ? ?No current facility-administered medications for this visit.  ? ? ?Medication Side Effects: None, lower libido ? ?Allergies: No Known Allergies ? ?Past Medical History:  ?Diagnosis Date  ? Medical history non-contributory   ? No pertinent past medical history   ? ? ?Family History  ?Problem Relation Age of Onset  ? Cancer Father   ? Stroke Maternal Grandmother   ? OCD Mother   ?     scrupulosity  ? OCD Brother   ?     contamination.  ? ? ?Social History  ? ?Socioeconomic History  ? Marital status: Married  ?  Spouse name: Not on file  ? Number of children: Not on file  ? Years of education: Not on file  ? Highest education level: Not on file  ?Occupational History  ? Not on file  ?Tobacco Use  ? Smoking status: Never  ? Smokeless tobacco: Never  ?Substance and Sexual Activity  ? Alcohol use: No  ? Drug use: No  ? Sexual activity: Yes  ?  Partners: Male  ?  Birth control/protection: None  ?  Comment: currently pregnant  ?Other Topics Concern  ? Not on file  ?Social History Narrative  ? Not on file  ? ?Social Determinants of Health  ? ?Financial Resource Strain: Not on file  ?Food Insecurity: Not on file  ?Transportation Needs: Not on file  ?Physical Activity: Not on file  ?Stress: Not on file  ?Social Connections: Not on file  ?Intimate Partner Violence: Not on file  ? ? ?Past Medical History, Surgical history, Social history, and Family history were reviewed and updated as appropriate.  ? ?Please see review of systems for further details on the patient's review from today.  ? ?Objective:  ? ?Physical  Exam:  ?There were no vitals taken for this visit. ? ?Physical Exam ?Neurological:  ?   Mental Status: She is alert and oriented to person, place, and time.  ?   Cranial Nerves: No dysarthria.  ?Psychiatric:     ?   Attention and Perception: Attention normal.     ?   Mood and Affect: Mood is anxious. Mood is not depressed.     ?   Speech: Speech normal.     ?   Behavior: Behavior is cooperative.     ?   Thought Content: Thought content normal. Thought  content is not paranoid or delusional. Thought content does not include homicidal or suicidal ideation.     ?   Cognition and Memory: Cognition and memory normal.     ?   Judgment: Judgment normal.  ?   Comments: OCD is a whole lot better with residual sx.  ? ? ?Lab Review:  ?No results found for: NA, K, CL, CO2, GLUCOSE, BUN, CREATININE, CALCIUM, PROT, ALBUMIN, AST, ALT, ALKPHOS, BILITOT, GFRNONAA, GFRAA ? ?   ?Component Value Date/Time  ? WBC 13.5 (H) 10/27/2015 0545  ? RBC 2.89 (L) 10/27/2015 0545  ? HGB 9.6 (L) 10/27/2015 0545  ? HCT 27.1 (L) 10/27/2015 0545  ? PLT 175 10/27/2015 0545  ? MCV 93.8 10/27/2015 0545  ? MCH 33.2 10/27/2015 0545  ? MCHC 35.4 10/27/2015 0545  ? RDW 13.6 10/27/2015 0545  ? ? ?No results found for: POCLITH, LITHIUM  ? ?No results found for: PHENYTOIN, PHENOBARB, VALPROATE, CBMZ  ? ?.res ?Assessment: Plan:   ? ?Mixed obsessional thoughts and acts - Plan: sertraline (ZOLOFT) 100 MG tablet ? ?Recurrent major depression in full remission (HCC) ? ?Generalized anxiety disorder  ? ?Greater than 50% of 30-minute non-face to face time with patient was spent on counseling and coordination of care. We Discussed the diagnosis of OCD in detail.  Her symptoms include contamination fears particularly of his sperm in public and some compulsive checking and mild cleaning rituals.  She had questions about the mechanism of action of SSRIs for OCD.  She appears to be getting the maximum benefit.  She is satisfied with the response.  She is not having side  effects.  Both she and her husband have noticed a marked benefit in the OCD and overall generalized anxiety but she would like further improvement if possible.  explained in detail that in general patient

## 2022-08-24 ENCOUNTER — Ambulatory Visit: Payer: Self-pay | Admitting: Psychiatry

## 2022-11-13 ENCOUNTER — Ambulatory Visit (INDEPENDENT_AMBULATORY_CARE_PROVIDER_SITE_OTHER): Payer: Self-pay | Admitting: Psychiatry

## 2022-11-13 ENCOUNTER — Encounter: Payer: Self-pay | Admitting: Psychiatry

## 2022-11-13 DIAGNOSIS — F3342 Major depressive disorder, recurrent, in full remission: Secondary | ICD-10-CM

## 2022-11-13 DIAGNOSIS — F422 Mixed obsessional thoughts and acts: Secondary | ICD-10-CM

## 2022-11-13 DIAGNOSIS — F411 Generalized anxiety disorder: Secondary | ICD-10-CM

## 2022-11-13 MED ORDER — SERTRALINE HCL 100 MG PO TABS: 300.0000 mg | ORAL_TABLET | Freq: Every day | ORAL | 1 refills | Status: DC

## 2022-11-13 NOTE — Progress Notes (Signed)
Rhonda Parrish [id number] 1981/01/14 41 y.o.      Subjective:   Patient ID:  Rhonda Parrish is a 41 y.o. (DOB [birth year]) female.  Chief Complaint:  Chief Complaint  Patient presents with   Follow-up    Mixed obsessional thoughts and acts   Depression   Anxiety    HPI Rhonda Parrish presents to the office today for follow-up of OCD and generalized anxiety disorder.  She was seen in 2012 but did not follow-up.  She presented again for evaluation and treatment January 14, 2019.  At Her  visit March 2020 and for her residual OCD we increased sertraline to 200 mg daily.  She was to follow-up in 2 months but it is been almost 6 months. She did increase the dosage.  She thinks OK.  She and H agree there is a marked difference with much less anxiety.  No longer chest pain with anxiety usually.  A little last night.  Obsessions are quieted and can see them a little more clearly for what they are.  A lot of her issues are germs and isolation has limited fears.  A little more obsessing as she tries to get out a little more.  Still some social anxiety and wishes it were a little quieter with obsessions. Still get triggered.  No sig SE  Therefore at her last visit September 2020 for residual OCD she was encouraged to increase sertraline to 250 mg daily for 2 months and if tolerated and needed increase to 300 mg daily.  We discussed studies that support higher doses of sertraline specifically for OCD.  Up to 300 mg daily for a couple of months late 2020.Rhonda Parrish  Increase to 250 made a difference but not much change at 300 mg daily.  A whole lot better than a year ago.  Still can struggle with depression.  Wonders if it's a SE.   Last week dark cloud and less motivation.  This week totally lifted.  Maybe hormonal.  No triggers.  H wondered if stopping D could make a difference. Better recognizing OCD with residual thoughts.  Lower sex drive.  No blunting.   05/12/20 appt reports doing well overall.   OCD  3-4/10 and manageable with little intensity and urgency.  Still aware of certain triggers but doesn't have to respond.  Able to delay response through ERP work.  Ex germs and can delay washing.Minimal SE.  covid is not that triggering.  Worried more about BA germs but not Covid. Plan: no med changes  03/22/2021 appointment with the following noted: Better with anxiety and OCD. Sertraline has taken off the edger. Emotions more stable and H notices.   Able to do a lot more and handle triggers.  Still has feelings of contamination.  Can put it in a box better saying it was OCD.  Wishes it was less than it is.  Still make decisions based on OCD thoughts.  Am I clean enough to do laundry.  Can take kids to public restroom.  No excessive showering.  SE ? Mild blunting, sex drive Plan continue sertraline 300 mg daily which is above the usual max.  She is aware.  03/21/2022 appointment with the following noted: Good and the same and much better than start.  Still OCD more than she'd like and still have to think about things and can interfere with activities.  Will still do things in certain order DT OCD.   Continues sertraline 300 mg daily.  No sig SE except  sexual.  Cleanliness obsessions.  Wishes she didn't notice. Plan: Continue sertraline 300 mg for another year. Option reduce sertraline over a month to see if sexual SE better  11/13/2022 appointment noted: Didn't decrease sertraline and stayed on 300 mg daily.  Got nervous about trying to reduce the dose.  New job this summer.  Able to work and function without anxiety interfering.  Still frustrating that she's always managing the obs thoughts.  Med enables her to function.  Can't do every day tasks like she would like.  Ex laundry.  Hard to change purse bc fears of contamination.   Some cleaning rituals and washing remains. Over all 80% better.  Patient reports stable mood and denies depressed or irritable moods. Depression intermittent.  Patient denies  difficulty with sleep initiation or maintenance and overall better with less EMA with panic. Denies appetite disturbance.  Patient reports that energy and motivation have been good.  Patient denies any difficulty with concentration.  Patient denies any suicidal ideation. Normal sleep with kids.  Past Psychiatric Medication Trials:  Sertraline 300 and no others. Hx Dr. Farrel Demark but not on meds at the time.  B has OCD ? Med  - made him tired  Review of Systems:  Review of Systems  Gastrointestinal:  Negative for diarrhea.  Neurological:  Negative for tremors.  Psychiatric/Behavioral:  The patient is nervous/anxious.     Medications: I have reviewed the patient's current medications.  Current Outpatient Medications  Medication Sig Dispense Refill   Cholecalciferol (VITAMIN D) 125 MCG (5000 UT) CAPS Take by mouth.     Multiple Vitamins-Minerals (MULTIPLE VITAMINS/WOMENS PO) Take by mouth.     Probiotic Product (PROBIOTIC-10 PO) Take by mouth.     sertraline (ZOLOFT) 100 MG tablet Take 3 tablets (300 mg total) by mouth daily. 270 tablet 1   No current facility-administered medications for this visit.    Medication Side Effects: None, lower libido  Allergies: No Known Allergies  Past Medical History:  Diagnosis Date   Medical history non-contributory    No pertinent past medical history     Family History  Problem Relation Age of Onset   Cancer Father    Stroke Maternal Grandmother    OCD Mother        scrupulosity   OCD Brother        contamination.    Social History   Socioeconomic History   Marital status: Married    Spouse name: Not on file   Number of children: Not on file   Years of education: Not on file   Highest education level: Not on file  Occupational History   Not on file  Tobacco Use   Smoking status: Never   Smokeless tobacco: Never  Substance and Sexual Activity   Alcohol use: No   Drug use: No   Sexual activity: Yes    Partners: Male    Birth  control/protection: None    Comment: currently pregnant  Other Topics Concern   Not on file  Social History Narrative   Not on file   Social Determinants of Health   Financial Resource Strain: Not on file  Food Insecurity: Not on file  Transportation Needs: Not on file  Physical Activity: Not on file  Stress: Not on file  Social Connections: Not on file  Intimate Partner Violence: Not on file    Past Medical History, Surgical history, Social history, and Family history were reviewed and updated as appropriate.   Please see review  of systems for further details on the patient's review from today.   Objective:   Physical Exam:  There were no vitals taken for this visit.  Physical Exam Neurological:     Mental Status: She is alert and oriented to person, place, and time.     Cranial Nerves: No dysarthria.  Psychiatric:        Attention and Perception: Attention normal.        Mood and Affect: Mood is anxious. Mood is not depressed.        Speech: Speech normal.        Behavior: Behavior is cooperative.        Thought Content: Thought content normal. Thought content is not paranoid or delusional. Thought content does not include homicidal or suicidal ideation.        Cognition and Memory: Cognition and memory normal.        Judgment: Judgment normal.     Comments: OCD is a whole lot better with residual sx.     Lab Review:  No results found for: "NA", "K", "CL", "CO2", "GLUCOSE", "BUN", "CREATININE", "CALCIUM", "PROT", "ALBUMIN", "AST", "ALT", "ALKPHOS", "BILITOT", "GFRNONAA", "GFRAA"     Component Value Date/Time   WBC 13.5 (H) 10/27/2015 0545   RBC 2.89 (L) 10/27/2015 0545   HGB 9.6 (L) 10/27/2015 0545   HCT 27.1 (L) 10/27/2015 0545   PLT 175 10/27/2015 0545   MCV 93.8 10/27/2015 0545   MCH 33.2 10/27/2015 0545   MCHC 35.4 10/27/2015 0545   RDW 13.6 10/27/2015 0545    No results found for: "POCLITH", "LITHIUM"   No results found for: "PHENYTOIN",  "PHENOBARB", "VALPROATE", "CBMZ"   .res Assessment: Plan:    Mixed obsessional thoughts and acts - Plan: sertraline (ZOLOFT) 100 MG tablet  Generalized anxiety disorder  Recurrent major depression in full remission (HCC)   Greater than 50% of 30-minute non-face to face time with patient was spent on counseling and coordination of care. We Discussed the diagnosis of OCD in detail.  Her symptoms include contamination fears particularly of his sperm in public and some compulsive checking and mild cleaning rituals.  She had questions about the mechanism of action of SSRIs for OCD.  She appears to be getting the maximum benefit.  She is satisfied with the response.  She is not having side effects.  Both she and her husband have noticed a marked benefit in the OCD and overall generalized anxiety but she would like further improvement if possible.  explained in detail that in general patients with OCD respond better and more fully the higher the dosage of sertraline particularly she has seen additional improvement on this high dose of sertraline over the last months since the last appointment.  We discussed the side effects in detail. Disc serotonin syndrome and SE and call if a problem.  Disc dealing with sexual SE with drug holiday.  Extensive discussion about the treatment course and plan including duration of treatment.  Answered questions about whether to possibly reduce later once sx controlled.  Disc ideal and realistic goals with OCD treatment.   Disc D and7 yo questions and some concern over some OCD issues.  Answered questions.  Disc various SSRI and other switch options to get rid of sexual SE. Option switch to fluvoxamine and or clomipramine but unlikely to improve over this in effectiveness.  First option is to reduce to reduce sexual SE.  She'll consider.  Disc again use high dose sertraline for OCD and Ninan  2004 article.  No significant depressive episodes since her last visit.    Again we discussed cognitive behavior therapy techniques and specifically exposure response prevention and I recommended that she immediately start practicing this technique.  Disc how find a therapist for tx.  Rec A Mitchum or [employer] as available.  FU 12 mos  Meredith Staggers, MD, DFAPA   Please see After Visit Summary for patient specific instructions.  No future appointments.  No orders of the defined types were placed in this encounter.     -------------------------------

## 2023-06-02 ENCOUNTER — Other Ambulatory Visit: Payer: Self-pay | Admitting: Psychiatry

## 2023-06-02 DIAGNOSIS — F422 Mixed obsessional thoughts and acts: Secondary | ICD-10-CM

## 2024-01-08 ENCOUNTER — Other Ambulatory Visit: Payer: Self-pay | Admitting: Psychiatry

## 2024-01-08 DIAGNOSIS — F422 Mixed obsessional thoughts and acts: Secondary | ICD-10-CM

## 2024-01-08 NOTE — Telephone Encounter (Signed)
Please schedule for a fu lv 11/13/22

## 2024-01-09 NOTE — Telephone Encounter (Signed)
Not seen since Nove 2023. Will refuse until gets apppt sched

## 2024-01-10 ENCOUNTER — Other Ambulatory Visit: Payer: Self-pay

## 2024-01-10 DIAGNOSIS — F422 Mixed obsessional thoughts and acts: Secondary | ICD-10-CM

## 2024-01-10 MED ORDER — SERTRALINE HCL 100 MG PO TABS: 300.0000 mg | ORAL_TABLET | Freq: Every day | ORAL | 2 refills | Status: DC

## 2024-01-10 NOTE — Telephone Encounter (Signed)
Sertaline 100mg  tid sent to [facility] per pt rqst.

## 2024-01-10 NOTE — Telephone Encounter (Signed)
PT is scheduled for 03/17/24

## 2024-03-17 ENCOUNTER — Ambulatory Visit (INDEPENDENT_AMBULATORY_CARE_PROVIDER_SITE_OTHER): Payer: Self-pay | Admitting: Psychiatry

## 2024-03-17 ENCOUNTER — Encounter: Payer: Self-pay | Admitting: Psychiatry

## 2024-03-17 DIAGNOSIS — F422 Mixed obsessional thoughts and acts: Secondary | ICD-10-CM

## 2024-03-17 DIAGNOSIS — F3281 Premenstrual dysphoric disorder: Secondary | ICD-10-CM

## 2024-03-17 DIAGNOSIS — F3342 Major depressive disorder, recurrent, in full remission: Secondary | ICD-10-CM

## 2024-03-17 DIAGNOSIS — F411 Generalized anxiety disorder: Secondary | ICD-10-CM

## 2024-03-17 MED ORDER — SERTRALINE HCL 100 MG PO TABS: ORAL_TABLET | ORAL | 5 refills | Status: DC

## 2024-03-17 NOTE — Progress Notes (Signed)
 Rhonda Parrish [id number] 1981/09/10 43 y.o.      Subjective:   Patient ID:  Rhonda Parrish is a 43 y.o. (DOB 1981/09/19) female.  Chief Complaint:  No chief complaint on file.   HPI Rhonda Parrish presents to the office today for follow-up of OCD and generalized anxiety disorder.  She was seen in 2012 but did not follow-up.  She presented again for evaluation and treatment January 14, 2019.  At Her  visit March 2020 and for her residual OCD we increased sertraline to 200 mg daily.  She was to follow-up in 2 months but it is been almost 6 months. She did increase the dosage.  She thinks OK.  She and H agree there is a marked difference with much less anxiety.  No longer chest pain with anxiety usually.  A little last night.  Obsessions are quieted and can see them a little more clearly for what they are.  A lot of her issues are germs and isolation has limited fears.  A little more obsessing as she tries to get out a little more.  Still some social anxiety and wishes it were a little quieter with obsessions. Still get triggered.  No sig SE  Therefore at her last visit September 2020 for residual OCD she was encouraged to increase sertraline to 250 mg daily for 2 months and if tolerated and needed increase to 300 mg daily.  We discussed studies that support higher doses of sertraline specifically for OCD.  Up to 300 mg daily for a couple of months late 2020.[name]  Increase to 250 made a difference but not much change at 300 mg daily.  A whole lot better than a year ago.  Still can struggle with depression.  Wonders if it's a SE.   Last week dark cloud and less motivation.  This week totally lifted.  Maybe hormonal.  No triggers.  H wondered if stopping D could make a difference. Better recognizing OCD with residual thoughts.  Lower sex drive.  No blunting.   05/12/20 appt reports doing well overall.   OCD 3-4/10 and manageable with little intensity and urgency.  Still aware of certain triggers but  doesn't have to respond.  Able to delay response through ERP work.  Ex germs and can delay washing.Minimal SE.  covid is not that triggering.  Worried more about BA germs but not Covid. Plan: no med changes  03/22/2021 appointment with the following noted: Better with anxiety and OCD. Sertraline has taken off the edger. Emotions more stable and H notices.   Able to do a lot more and handle triggers.  Still has feelings of contamination.  Can put it in a box better saying it was OCD.  Wishes it was less than it is.  Still make decisions based on OCD thoughts.  Am I clean enough to do laundry.  Can take kids to public restroom.  No excessive showering.  SE ? Mild blunting, sex drive Plan continue sertraline 300 mg daily which is above the usual max.  She is aware.  03/21/2022 appointment with the following noted: Good and the same and much better than start.  Still OCD more than she'd like and still have to think about things and can interfere with activities.  Will still do things in certain order DT OCD.   Continues sertraline 300 mg daily.  No sig SE except sexual.  Cleanliness obsessions.  Wishes she didn't notice. Plan: Continue sertraline 300 mg for another year. Option reduce  sertraline over a month to see if sexual SE better  11/13/2022 appointment noted: Didn't decrease sertraline and stayed on 300 mg daily.  Got nervous about trying to reduce the dose.  New job this summer.  Able to work and function without anxiety interfering.  Still frustrating that she's always managing the obs thoughts.  Med enables her to function.  Can't do every day tasks like she would like.  Ex laundry.  Hard to change purse bc fears of contamination.   Some cleaning rituals and washing remains. Over all 80% better. Patient reports stable mood and denies depressed or irritable moods. Depression intermittent.  Patient denies difficulty with sleep initiation or maintenance and overall better with less EMA with panic.  Denies appetite disturbance.  Patient reports that energy and motivation have been good.  Patient denies any difficulty with concentration.  Patient denies any suicidal ideation. Normal sleep with kids.  03/17/24 appt noted: Meds: sertraline 300 SE sexual  Anxiety is light years better but not gone.  Still contamination issues.  Certain spots in the house are hard for her.  Anxiety is kind of normal and handles it better.  PMS raises anxiety.  Has regular cycles.   Minimal dep but not completely gone.  Pms dep also . 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  Introvert and gets worn out at end of the day.  Feels zapped after working with school. Social anxiety.   Disc CBT and ERP   Past Psychiatric Medication Trials:  Sertraline 300 and no others. Hx Dr. Farrel Demark but not on meds at the time.  M & B has OCD.  ? Med   Review of Systems:  Review of Systems  Gastrointestinal:  Negative for diarrhea.  Neurological:  Negative for tremors.  Psychiatric/Behavioral:  Positive for dysphoric mood. The patient is nervous/anxious.     Medications: I have reviewed the patient's current medications.  Current Outpatient Medications  Medication Sig Dispense Refill   Cholecalciferol (VITAMIN D) 125 MCG (5000 UT) CAPS Take by mouth.     Multiple Vitamins-Minerals (MULTIPLE VITAMINS/WOMENS PO) Take by mouth.     Probiotic Product (PROBIOTIC-10 PO) Take by mouth.     sertraline (ZOLOFT) 100 MG tablet Take 3 tablets (300 mg total) by mouth daily. 90 tablet 2   No current facility-administered medications for this visit.    Medication Side Effects: None, lower libido  Allergies: No Known Allergies  Past Medical History:  Diagnosis Date   Medical history non-contributory    No pertinent past medical history     Family History  Problem Relation Age of Onset   Cancer Father    Stroke Maternal Grandmother    OCD Mother        scrupulosity   OCD Brother        contamination.    Social History    Socioeconomic History   Marital status: Married    Spouse name: Not on file   Number of children: Not on file   Years of education: Not on file   Highest education level: Not on file  Occupational History   Not on file  Tobacco Use   Smoking status: Never   Smokeless tobacco: Never  Substance and Sexual Activity   Alcohol use: No   Drug use: No   Sexual activity: Yes    Partners: Male    Birth control/protection: None    Comment: currently pregnant  Other Topics Concern   Not on file  Social History Narrative  Not on file   Social Drivers of Health   Financial Resource Strain: Not on file  Food Insecurity: Not on file  Transportation Needs: Not on file  Physical Activity: Not on file  Stress: Not on file  Social Connections: Not on file  Intimate Partner Violence: Not on file    Past Medical History, Surgical history, Social history, and Family history were reviewed and updated as appropriate.   Please see review of systems for further details on the patient's review from today.   Objective:   Physical Exam:  There were no vitals taken for this visit.  Physical Exam Neurological:     Mental Status: She is alert and oriented to person, place, and time.     Cranial Nerves: No dysarthria.  Psychiatric:        Attention and Perception: Attention normal.        Mood and Affect: Mood is anxious and depressed.        Speech: Speech normal.        Behavior: Behavior is cooperative.        Thought Content: Thought content normal. Thought content is not paranoid or delusional. Thought content does not include homicidal or suicidal ideation.        Cognition and Memory: Cognition and memory normal.        Judgment: Judgment normal.     Comments: OCD is a whole lot better with residual sx. Mild secondary dep     Lab Review:  No results found for: "NA", "K", "CL", "CO2", "GLUCOSE", "BUN", "CREATININE", "CALCIUM", "PROT", "ALBUMIN", "AST", "ALT", "ALKPHOS",  "BILITOT", "GFRNONAA", "GFRAA"     Component Value Date/Time   WBC 13.5 (H) 10/27/2015 0545   RBC 2.89 (L) 10/27/2015 0545   HGB 9.6 (L) 10/27/2015 0545   HCT 27.1 (L) 10/27/2015 0545   PLT 175 10/27/2015 0545   MCV 93.8 10/27/2015 0545   MCH 33.2 10/27/2015 0545   MCHC 35.4 10/27/2015 0545   RDW 13.6 10/27/2015 0545    No results found for: "POCLITH", "LITHIUM"   No results found for: "PHENYTOIN", "PHENOBARB", "VALPROATE", "CBMZ"   .res Assessment: Plan:    No diagnosis found.   30-minute non-face to face time with patient was spent on counseling and coordination of care. We Discussed the diagnosis of OCD in detail.  Her symptoms include contamination fears particularly of his sperm in public and some compulsive checking and mild cleaning rituals.  She had questions about the mechanism of action of SSRIs for OCD.   She is not having side effects except sexual.  Both she and her husband have noticed a marked benefit in the OCD and overall generalized anxiety but she would like further improvement if possible.  explained in detail that in general patients with OCD respond better and more fully the higher the dosage of sertraline particularly she has seen additional improvement on this high dose of sertraline over the last months since the last appointment.  We discussed the side effects in detail. Disc serotonin syndrome and SE and call if a problem.  Disc dealing with sexual SE with drug holiday.  Disc various SSRI and other switch options to get rid of sexual SE. Option switch to fluvoxamine and or clomipramine but unlikely to improve over this in effectiveness.  Continue sertraline 300 and add 50 with PMS  Disc again use high dose sertraline for OCD and Ninan 2004 article.  No significant depressive episodes since her last visit.   Counseling 20  min:  Again we discussed cognitive behavior therapy techniques and specifically exposure response prevention and I recommended that she  immediately start practicing this technique.  Disc option return therapist for tx. A Mitchum . Extensive discussion about the treatment course and plan including duration of treatment.  Answered questions about whether to possibly reduce later once sx controlled.  Disc ideal and realistic goals with OCD treatment.    FU 12 mos  Rhonda Staggers, MD, DFAPA   Please see After Visit Summary for patient specific instructions.  No future appointments.  No orders of the defined types were placed in this encounter.     -------------------------------

## 2024-08-21 ENCOUNTER — Other Ambulatory Visit: Payer: Self-pay

## 2024-08-21 DIAGNOSIS — F422 Mixed obsessional thoughts and acts: Secondary | ICD-10-CM

## 2024-08-21 MED ORDER — SERTRALINE HCL 100 MG PO TABS: ORAL_TABLET | ORAL | 0 refills | Status: DC

## 2024-08-28 ENCOUNTER — Other Ambulatory Visit: Payer: Self-pay | Admitting: Family Medicine

## 2024-08-28 DIAGNOSIS — Z1231 Encounter for screening mammogram for malignant neoplasm of breast: Secondary | ICD-10-CM

## 2024-09-04 ENCOUNTER — Other Ambulatory Visit: Payer: Self-pay | Admitting: Family Medicine

## 2024-09-04 DIAGNOSIS — N644 Mastodynia: Secondary | ICD-10-CM

## 2024-09-17 ENCOUNTER — Ambulatory Visit: Payer: Self-pay | Admitting: Psychiatry

## 2024-10-23 ENCOUNTER — Ambulatory Visit
Admission: RE | Admit: 2024-10-23 | Discharge: 2024-10-23 | Disposition: A | Discharge status: Home / Self Care | Payer: Self-pay | Source: Ambulatory Visit | Attending: Family Medicine | Admitting: Family Medicine

## 2024-10-23 ENCOUNTER — Other Ambulatory Visit: Payer: Self-pay

## 2024-10-23 DIAGNOSIS — N644 Mastodynia: Secondary | ICD-10-CM

## 2024-11-24 ENCOUNTER — Encounter: Payer: Self-pay | Admitting: Psychiatry

## 2024-11-24 ENCOUNTER — Ambulatory Visit (INDEPENDENT_AMBULATORY_CARE_PROVIDER_SITE_OTHER): Payer: Self-pay | Admitting: Psychiatry

## 2024-11-24 DIAGNOSIS — F422 Mixed obsessional thoughts and acts: Secondary | ICD-10-CM

## 2024-11-24 DIAGNOSIS — F3281 Premenstrual dysphoric disorder: Secondary | ICD-10-CM

## 2024-11-24 DIAGNOSIS — F3342 Major depressive disorder, recurrent, in full remission: Secondary | ICD-10-CM

## 2024-11-24 DIAGNOSIS — F411 Generalized anxiety disorder: Secondary | ICD-10-CM

## 2024-11-24 MED ORDER — SERTRALINE HCL 100 MG PO TABS: 300.0000 mg | ORAL_TABLET | Freq: Every day | ORAL | 3 refills | Status: AC

## 2024-11-24 NOTE — Progress Notes (Signed)
 Rhonda Parrish [id number] 1981/07/17 43 y.o.      Subjective:   Patient ID:  Rhonda Parrish is a 43 y.o. (DOB 1981-04-23) female.  Chief Complaint:  Chief Complaint  Patient presents with   Follow-up   Anxiety    HPI Rhonda Parrish presents to the office today for follow-up of OCD and generalized anxiety disorder.  She was seen in 2012 but did not follow-up.  She presented again for evaluation and treatment January 14, 2019.  At Her  visit March 2020 and for her residual OCD we increased sertraline  to 200 mg daily.  She was to follow-up in 2 months but it is been almost 6 months. She did increase the dosage.  She thinks OK.  She and H agree there is a marked difference with much less anxiety.  No longer chest pain with anxiety usually.  A little last night.  Obsessions are quieted and can see them a little more clearly for what they are.  A lot of her issues are germs and isolation has limited fears.  A little more obsessing as she tries to get out a little more.  Still some social anxiety and wishes it were a little quieter with obsessions. Still get triggered.  No sig SE  Therefore at her last visit September 2020 for residual OCD she was encouraged to increase sertraline  to 250 mg daily for 2 months and if tolerated and needed increase to 300 mg daily.  We discussed studies that support higher doses of sertraline  specifically for OCD.  Up to 300 mg daily for a couple of months late 2020.SABRA  Increase to 250 made a difference but not much change at 300 mg daily.  A whole lot better than a year ago.  Still can struggle with depression.  Wonders if it's a SE.   Last week dark cloud and less motivation.  This week totally lifted.  Maybe hormonal.  No triggers.  H wondered if stopping D could make a difference. Better recognizing OCD with residual thoughts.  Lower sex drive.  No blunting.   05/12/20 appt reports doing well overall.   OCD 3-4/10 and manageable with little intensity and urgency.   Still aware of certain triggers but doesn't have to respond.  Able to delay response through ERP work.  Ex germs and can delay washing.Minimal SE.  covid is not that triggering.  Worried more about BA germs but not Covid. Plan: no med changes  03/22/2021 appointment with the following noted: Better with anxiety and OCD. Sertraline  has taken off the edger. Emotions more stable and H notices.   Able to do a lot more and handle triggers.  Still has feelings of contamination.  Can put it in a box better saying it was OCD.  Wishes it was less than it is.  Still make decisions based on OCD thoughts.  Am I clean enough to do laundry.  Can take kids to public restroom.  No excessive showering.  SE ? Mild blunting, sex drive Plan continue sertraline  300 mg daily which is above the usual max.  She is aware.  03/21/2022 appointment with the following noted: Good and the same and much better than start.  Still OCD more than she'd like and still have to think about things and can interfere with activities.  Will still do things in certain order DT OCD.   Continues sertraline  300 mg daily.  No sig SE except sexual.  Cleanliness obsessions.  Wishes she didn't notice. Plan: Continue  sertraline  300 mg for another year. Option reduce sertraline  over a month to see if sexual SE better  11/13/2022 appointment noted: Didn't decrease sertraline  and stayed on 300 mg daily.  Got nervous about trying to reduce the dose.  New job this summer.  Able to work and function without anxiety interfering.  Still frustrating that she's always managing the obs thoughts.  Med enables her to function.  Can't do every day tasks like she would like.  Ex laundry.  Hard to change purse bc fears of contamination.   Some cleaning rituals and washing remains. Over all 80% better. Patient reports stable mood and denies depressed or irritable moods. Depression intermittent.  Patient denies difficulty with sleep initiation or maintenance and  overall better with less EMA with panic. Denies appetite disturbance.  Patient reports that energy and motivation have been good.  Patient denies any difficulty with concentration.  Patient denies any suicidal ideation. Normal sleep with kids.  03/17/24 appt noted: Meds: sertraline  300 SE sexual  Anxiety is light years better but not gone.  Still contamination issues.  Certain spots in the house are hard for her.  Anxiety is kind of normal and handles it better.  PMS raises anxiety.  Has regular cycles.   Minimal dep but not completely gone.  Pms dep also . 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  Introvert and gets worn out at end of the day.  Feels zapped after working with school. Social anxiety.   Disc CBT and ERP  11/24/24 appt noted:  Med: sertraline  300 SE sexual  I thing it's good.  Satisfied with benefit  and SE. OCD still there.  Not consuming much time but always present with awareness.  Still worrying over cleanliness  but not paralyzing like in the past.   No health px.   Sleep good.  Not dep  Past Psychiatric Medication Trials:  Sertraline  300 and no others. Hx Dr. Marijean but not on meds at the time.  M & B has OCD.  ? Med   Review of Systems:  Review of Systems  Gastrointestinal:  Negative for diarrhea.  Neurological:  Negative for tremors.  Psychiatric/Behavioral:  Negative for dysphoric mood. The patient is nervous/anxious.     Medications: I have reviewed the patient's current medications.  Current Outpatient Medications  Medication Sig Dispense Refill   Cholecalciferol (VITAMIN D) 125 MCG (5000 UT) CAPS Take by mouth.     Multiple Vitamins-Minerals (MULTIPLE VITAMINS/WOMENS PO) Take by mouth.     Probiotic Product (PROBIOTIC-10 PO) Take by mouth.     sertraline  (ZOLOFT ) 100 MG tablet Take 3 tablets (300 mg total) by mouth daily. 270 tablet 3   No current facility-administered medications for this visit.    Medication Side Effects: None, lower  libido  Allergies: No Known Allergies  Past Medical History:  Diagnosis Date   Medical history non-contributory    No pertinent past medical history     Family History  Problem Relation Age of Onset   Cancer Father    Stroke Maternal Grandmother    OCD Mother        scrupulosity   OCD Brother        contamination.    Social History   Socioeconomic History   Marital status: Married    Spouse name: Not on file   Number of children: Not on file   Years of education: Not on file   Highest education level: Not on file  Occupational History  Not on file  Tobacco Use   Smoking status: Never   Smokeless tobacco: Never  Substance and Sexual Activity   Alcohol use: No   Drug use: No   Sexual activity: Yes    Partners: Male    Birth control/protection: None    Comment: currently pregnant  Other Topics Concern   Not on file  Social History Narrative   Not on file   Social Drivers of Health   Financial Resource Strain: Not on file  Food Insecurity: Not on file  Transportation Needs: Not on file  Physical Activity: Not on file  Stress: Not on file  Social Connections: Not on file  Intimate Partner Violence: Not on file    Past Medical History, Surgical history, Social history, and Family history were reviewed and updated as appropriate.   Please see review of systems for further details on the patient's review from today.   Objective:   Physical Exam:  There were no vitals taken for this visit.  Physical Exam Constitutional:      General: She is not in acute distress.    Appearance: She is well-developed.  Musculoskeletal:        General: No deformity.  Neurological:     Mental Status: She is alert and oriented to person, place, and time.     Cranial Nerves: No dysarthria.     Coordination: Coordination normal.  Psychiatric:        Attention and Perception: Attention normal.        Mood and Affect: Mood is anxious. Mood is not depressed. Affect is not  labile, blunt, angry or inappropriate.        Speech: Speech normal.        Behavior: Behavior normal. Behavior is cooperative.        Thought Content: Thought content normal. Thought content is not paranoid or delusional. Thought content does not include homicidal or suicidal ideation. Thought content does not include homicidal or suicidal plan.        Cognition and Memory: Cognition and memory normal.        Judgment: Judgment normal.     Comments: OCD is a whole lot better with residual sx.      Lab Review:  No results found for: NA, K, CL, CO2, GLUCOSE, BUN, CREATININE, CALCIUM, PROT, ALBUMIN, AST, ALT, ALKPHOS, BILITOT, GFRNONAA, GFRAA     Component Value Date/Time   WBC 13.5 (H) 10/27/2015 0545   RBC 2.89 (L) 10/27/2015 0545   HGB 9.6 (L) 10/27/2015 0545   HCT 27.1 (L) 10/27/2015 0545   PLT 175 10/27/2015 0545   MCV 93.8 10/27/2015 0545   MCH 33.2 10/27/2015 0545   MCHC 35.4 10/27/2015 0545   RDW 13.6 10/27/2015 0545    No results found for: POCLITH, LITHIUM   No results found for: PHENYTOIN, PHENOBARB, VALPROATE, CBMZ   .res Assessment: Plan:    Mixed obsessional thoughts and acts - Plan: sertraline  (ZOLOFT ) 100 MG tablet  Generalized anxiety disorder  Recurrent major depression in full remission  PMDD (premenstrual dysphoric disorder)   30-minute non-face to face time with patient was spent. We Discussed the diagnosis of OCD in detail.  Her symptoms include contamination fears particularly of his sperm in public and some compulsive checking and mild cleaning rituals.  She had questions about the mechanism of action of SSRIs for OCD.   She is not having side effects except sexual.  Both she and her husband have noticed a marked benefit in  the OCD and overall generalized anxiety but she would like further improvement if possible.  explained in detail that in general patients with OCD respond better and more fully the higher  the dosage of sertraline  particularly she has seen additional improvement on this high dose of sertraline  over the last months since the last appointment.  We discussed the side effects in detail. Disc serotonin syndrome and SE and call if a problem.  Disc dealing with sexual SE with drug holiday.  Disc various SSRI and other switch options to get rid of sexual SE. Option switch to fluvoxamine and or clomipramine but unlikely to improve over this in effectiveness.  Continue sertraline  300 and add 50 with PMS  Previously Disc again use high dose sertraline  for OCD and [place] 2004 article.  No significant depressive episodes since her last visit.   FU 12 mos  Lorene Macintosh, MD, DFAPA   Please see After Visit Summary for patient specific instructions.  No future appointments.  No orders of the defined types were placed in this encounter.     -------------------------------

## 2024-11-27 ENCOUNTER — Other Ambulatory Visit: Payer: Self-pay | Admitting: Psychiatry

## 2024-11-27 DIAGNOSIS — F422 Mixed obsessional thoughts and acts: Secondary | ICD-10-CM

## 2025-11-30 ENCOUNTER — Ambulatory Visit: Payer: Self-pay | Admitting: Psychiatry
